# Patient Record
Sex: Male | Born: 1950 | Race: Black or African American | Hispanic: No | Marital: Married | Smoking: Former smoker
Health system: Southern US, Community
[De-identification: ages and names within clinical notes are randomized; demographics above are authoritative.]

---

## 2003-05-24 DIAGNOSIS — B20 Human immunodeficiency virus [HIV] disease: Secondary | ICD-10-CM

## 2006-03-09 ENCOUNTER — Ambulatory Visit: Payer: Self-pay | Admitting: Infectious Diseases

## 2006-03-09 ENCOUNTER — Encounter: Admission: RE | Admit: 2006-03-09 | Discharge: 2006-03-09 | Payer: Self-pay | Admitting: Infectious Diseases

## 2006-03-09 ENCOUNTER — Encounter (INDEPENDENT_AMBULATORY_CARE_PROVIDER_SITE_OTHER): Payer: Self-pay | Admitting: *Deleted

## 2006-03-25 ENCOUNTER — Ambulatory Visit: Payer: Self-pay | Admitting: Infectious Diseases

## 2006-04-26 ENCOUNTER — Ambulatory Visit: Payer: Self-pay | Admitting: Infectious Diseases

## 2006-04-26 ENCOUNTER — Encounter (INDEPENDENT_AMBULATORY_CARE_PROVIDER_SITE_OTHER): Payer: Self-pay | Admitting: *Deleted

## 2006-04-26 ENCOUNTER — Encounter: Admission: RE | Admit: 2006-04-26 | Discharge: 2006-04-26 | Payer: Self-pay | Admitting: Infectious Diseases

## 2006-07-21 ENCOUNTER — Encounter: Admission: RE | Admit: 2006-07-21 | Discharge: 2006-07-21 | Payer: Self-pay | Admitting: Infectious Diseases

## 2006-07-21 ENCOUNTER — Ambulatory Visit: Payer: Self-pay | Admitting: Infectious Diseases

## 2006-07-21 ENCOUNTER — Encounter (INDEPENDENT_AMBULATORY_CARE_PROVIDER_SITE_OTHER): Payer: Self-pay | Admitting: *Deleted

## 2006-11-10 ENCOUNTER — Encounter (INDEPENDENT_AMBULATORY_CARE_PROVIDER_SITE_OTHER): Payer: Self-pay | Admitting: *Deleted

## 2006-11-10 ENCOUNTER — Ambulatory Visit: Payer: Self-pay | Admitting: Infectious Diseases

## 2006-11-10 ENCOUNTER — Encounter: Admission: RE | Admit: 2006-11-10 | Discharge: 2006-11-10 | Payer: Self-pay | Admitting: Infectious Diseases

## 2006-11-10 DIAGNOSIS — A6000 Herpesviral infection of urogenital system, unspecified: Secondary | ICD-10-CM | POA: Insufficient documentation

## 2006-11-10 DIAGNOSIS — I10 Essential (primary) hypertension: Secondary | ICD-10-CM | POA: Insufficient documentation

## 2006-11-10 DIAGNOSIS — F17200 Nicotine dependence, unspecified, uncomplicated: Secondary | ICD-10-CM | POA: Insufficient documentation

## 2006-11-10 LAB — CONVERTED CEMR LAB
Albumin: 4.3 g/dL (ref 3.5–5.2)
BUN: 12 mg/dL (ref 6–23)
CD4 Count: 250 microliters
CO2: 27 meq/L (ref 19–32)
Calcium: 9.6 mg/dL (ref 8.4–10.5)
Chloride: 106 meq/L (ref 96–112)
Cholesterol: 181 mg/dL (ref 0–200)
Glucose, Bld: 84 mg/dL (ref 70–99)
HDL: 36 mg/dL — ABNORMAL LOW (ref >39)
HIV 1 RNA Quant: 312 copies/mL
Hemoglobin: 14.6 g/dL (ref 13.0–17.0)
MCHC: 34.3 g/dL (ref 30.0–36.0)
Potassium: 4.5 meq/L (ref 3.5–5.3)
RBC: 4.26 M/uL (ref 4.22–5.81)
Triglycerides: 205 mg/dL — ABNORMAL HIGH (ref <150)

## 2006-11-19 ENCOUNTER — Encounter: Payer: Self-pay | Admitting: Infectious Diseases

## 2006-12-20 ENCOUNTER — Encounter (INDEPENDENT_AMBULATORY_CARE_PROVIDER_SITE_OTHER): Payer: Self-pay | Admitting: *Deleted

## 2006-12-20 LAB — CONVERTED CEMR LAB

## 2007-01-02 ENCOUNTER — Encounter (INDEPENDENT_AMBULATORY_CARE_PROVIDER_SITE_OTHER): Payer: Self-pay | Admitting: *Deleted

## 2007-01-31 ENCOUNTER — Ambulatory Visit: Payer: Self-pay | Admitting: Infectious Diseases

## 2007-03-23 ENCOUNTER — Telehealth: Payer: Self-pay | Admitting: Infectious Diseases

## 2007-06-24 ENCOUNTER — Telehealth: Payer: Self-pay | Admitting: Infectious Diseases

## 2007-07-25 ENCOUNTER — Telehealth: Payer: Self-pay | Admitting: Infectious Diseases

## 2007-08-11 ENCOUNTER — Ambulatory Visit: Payer: Self-pay | Admitting: Infectious Disease

## 2007-08-11 ENCOUNTER — Encounter (INDEPENDENT_AMBULATORY_CARE_PROVIDER_SITE_OTHER): Payer: Self-pay | Admitting: *Deleted

## 2007-09-20 ENCOUNTER — Ambulatory Visit: Payer: Self-pay | Admitting: Infectious Diseases

## 2007-09-20 ENCOUNTER — Encounter: Admission: RE | Admit: 2007-09-20 | Discharge: 2007-09-20 | Payer: Self-pay | Admitting: Infectious Diseases

## 2007-09-20 LAB — CONVERTED CEMR LAB
CO2: 26 meq/L (ref 19–32)
Calcium: 9.8 mg/dL (ref 8.4–10.5)
Chloride: 105 meq/L (ref 96–112)
Creatinine, Ser: 0.83 mg/dL (ref 0.40–1.50)
Glucose, Bld: 81 mg/dL (ref 70–99)
HCT: 43.7 % (ref 39.0–52.0)
HIV 1 RNA Quant: <50 copies/mL (ref <50)
HIV-1 RNA Quant, Log: <1.7 (ref <1.70)
MCV: 100.9 fL — ABNORMAL HIGH (ref 78.0–100.0)
RBC: 4.33 M/uL (ref 4.22–5.81)
Sodium: 141 meq/L (ref 135–145)
Total Bilirubin: 0.3 mg/dL (ref 0.3–1.2)
Total Protein: 7.7 g/dL (ref 6.0–8.3)
WBC: 3.9 10*3/uL — ABNORMAL LOW (ref 4.0–10.5)

## 2007-10-06 ENCOUNTER — Telehealth: Payer: Self-pay | Admitting: Infectious Diseases

## 2007-10-25 ENCOUNTER — Encounter (INDEPENDENT_AMBULATORY_CARE_PROVIDER_SITE_OTHER): Payer: Self-pay | Admitting: *Deleted

## 2008-05-22 ENCOUNTER — Encounter: Payer: Self-pay | Admitting: Infectious Diseases

## 2008-07-13 ENCOUNTER — Ambulatory Visit: Payer: Self-pay | Admitting: Infectious Diseases

## 2008-07-13 LAB — CONVERTED CEMR LAB
ALT: 19 units/L (ref 0–53)
AST: 18 units/L (ref 0–37)
Albumin: 4.3 g/dL (ref 3.5–5.2)
Basophils Absolute: 0 10*3/uL (ref 0.0–0.1)
Basophils Relative: 1 % (ref 0–1)
CO2: 26 meq/L (ref 19–32)
Calcium: 9.2 mg/dL (ref 8.4–10.5)
Chloride: 107 meq/L (ref 96–112)
Cholesterol: 167 mg/dL (ref 0–200)
HIV 1 RNA Quant: 350 copies/mL — ABNORMAL HIGH (ref <50)
HIV-1 RNA Quant, Log: 2.54 — ABNORMAL HIGH (ref <1.70)
Hemoglobin: 14.2 g/dL (ref 13.0–17.0)
Lymphocytes Relative: 39 % (ref 12–46)
Monocytes Absolute: 0.3 10*3/uL (ref 0.1–1.0)
Neutro Abs: 2.3 10*3/uL (ref 1.7–7.7)
Neutrophils Relative %: 51 % (ref 43–77)
Platelets: 305 10*3/uL (ref 150–400)
Potassium: 4.7 meq/L (ref 3.5–5.3)
RDW: 13.5 % (ref 11.5–15.5)
Sodium: 141 meq/L (ref 135–145)
Total Protein: 7 g/dL (ref 6.0–8.3)

## 2008-07-25 ENCOUNTER — Ambulatory Visit: Payer: Self-pay | Admitting: Infectious Diseases

## 2008-08-27 ENCOUNTER — Telehealth: Payer: Self-pay | Admitting: Infectious Diseases

## 2008-11-13 ENCOUNTER — Ambulatory Visit: Payer: Self-pay | Admitting: Infectious Diseases

## 2008-11-13 LAB — CONVERTED CEMR LAB
ALT: 23 units/L (ref 0–53)
AST: 21 units/L (ref 0–37)
Albumin: 4.4 g/dL (ref 3.5–5.2)
BUN: 8 mg/dL (ref 6–23)
Basophils Absolute: 0 10*3/uL (ref 0.0–0.1)
Basophils Relative: 0 % (ref 0–1)
CO2: 25 meq/L (ref 19–32)
Calcium: 9.4 mg/dL (ref 8.4–10.5)
Chloride: 108 meq/L (ref 96–112)
Cholesterol: 177 mg/dL (ref 0–200)
Creatinine, Ser: 0.75 mg/dL (ref 0.40–1.50)
HIV-1 RNA Quant, Log: 2.83 — ABNORMAL HIGH (ref <1.68)
Hemoglobin: 13.3 g/dL (ref 13.0–17.0)
Hep A Total Ab: NEGATIVE
Lymphocytes Relative: 35 % (ref 12–46)
MCHC: 33.9 g/dL (ref 30.0–36.0)
Monocytes Absolute: 0.4 10*3/uL (ref 0.1–1.0)
Monocytes Relative: 7 % (ref 3–12)
Neutro Abs: 3.1 10*3/uL (ref 1.7–7.7)
Neutrophils Relative %: 57 % (ref 43–77)
Potassium: 4.3 meq/L (ref 3.5–5.3)
RBC: 4 M/uL — ABNORMAL LOW (ref 4.22–5.81)

## 2008-11-14 ENCOUNTER — Encounter: Payer: Self-pay | Admitting: Infectious Diseases

## 2008-11-27 ENCOUNTER — Ambulatory Visit: Payer: Self-pay | Admitting: Infectious Diseases

## 2008-12-06 ENCOUNTER — Ambulatory Visit: Payer: Self-pay | Admitting: Gastroenterology

## 2008-12-07 ENCOUNTER — Encounter (INDEPENDENT_AMBULATORY_CARE_PROVIDER_SITE_OTHER): Payer: Self-pay | Admitting: *Deleted

## 2008-12-18 ENCOUNTER — Ambulatory Visit: Payer: Self-pay | Admitting: Infectious Diseases

## 2008-12-18 ENCOUNTER — Ambulatory Visit (HOSPITAL_COMMUNITY): Admission: RE | Admit: 2008-12-18 | Discharge: 2008-12-18 | Payer: Self-pay | Admitting: Infectious Diseases

## 2008-12-18 ENCOUNTER — Telehealth (INDEPENDENT_AMBULATORY_CARE_PROVIDER_SITE_OTHER): Payer: Self-pay | Admitting: *Deleted

## 2008-12-18 DIAGNOSIS — J321 Chronic frontal sinusitis: Secondary | ICD-10-CM

## 2008-12-19 DIAGNOSIS — I4949 Other premature depolarization: Secondary | ICD-10-CM

## 2009-04-17 ENCOUNTER — Ambulatory Visit: Payer: Self-pay | Admitting: Infectious Diseases

## 2009-04-17 LAB — CONVERTED CEMR LAB
ALT: 18 units/L (ref 0–53)
AST: 19 units/L (ref 0–37)
Albumin: 4.3 g/dL (ref 3.5–5.2)
Alkaline Phosphatase: 87 units/L (ref 39–117)
Basophils Absolute: 0.1 10*3/uL (ref 0.0–0.1)
Eosinophils Absolute: 0.1 10*3/uL (ref 0.0–0.7)
Eosinophils Relative: 3 % (ref 0–5)
GFR calc non Af Amer: >60 mL/min (ref >60)
HCT: 40.1 % (ref 39.0–52.0)
HIV-1 RNA Quant, Log: 1.98 — ABNORMAL HIGH (ref <1.68)
Hemoglobin: 13.3 g/dL (ref 13.0–17.0)
Lymphocytes Relative: 38 % (ref 12–46)
MCHC: 33.2 g/dL (ref 30.0–36.0)
MCV: 98.5 fL (ref 78.0–100.0)
Monocytes Absolute: 0.4 10*3/uL (ref 0.1–1.0)
Potassium: 4.9 meq/L (ref 3.5–5.3)
RDW: 13.9 % (ref 11.5–15.5)
Sodium: 144 meq/L (ref 135–145)
Total Bilirubin: 0.2 mg/dL — ABNORMAL LOW (ref 0.3–1.2)
Total Protein: 7.3 g/dL (ref 6.0–8.3)

## 2009-05-01 ENCOUNTER — Ambulatory Visit: Payer: Self-pay | Admitting: Infectious Diseases

## 2009-05-01 ENCOUNTER — Ambulatory Visit (HOSPITAL_COMMUNITY): Admission: RE | Admit: 2009-05-01 | Discharge: 2009-05-01 | Payer: Self-pay | Admitting: Infectious Diseases

## 2009-05-01 DIAGNOSIS — M549 Dorsalgia, unspecified: Secondary | ICD-10-CM | POA: Insufficient documentation

## 2009-05-14 ENCOUNTER — Telehealth (INDEPENDENT_AMBULATORY_CARE_PROVIDER_SITE_OTHER): Payer: Self-pay | Admitting: *Deleted

## 2009-08-26 IMAGING — CR DG LUMBAR SPINE COMPLETE 4+V
5 series · 5 of 5 positions shown · non-contrast
Comparison: None.

CLINICAL DATA: 58-year-old male with recent injury and low back,
right hip pain.

LUMBAR SPINE - COMPLETE 4+ VIEW

[t l-spine a.p.]
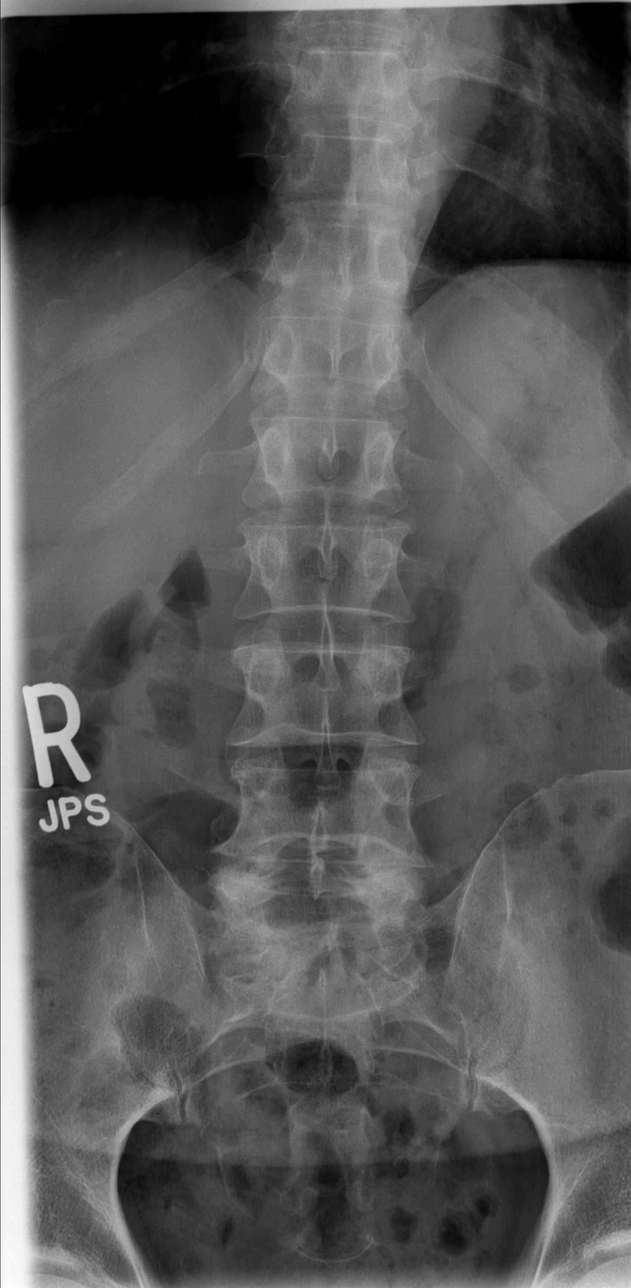

[t l-spine oblique exposure (1 of 2)]
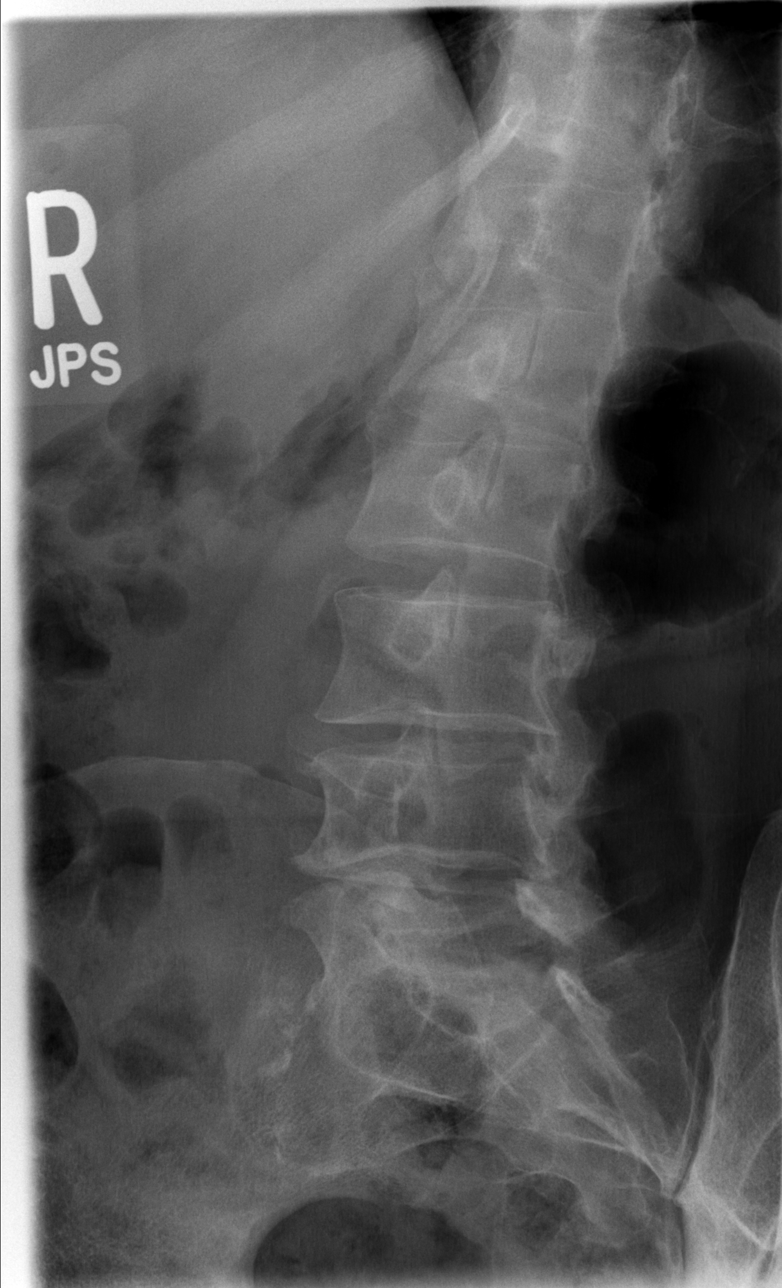

[t l-spine oblique exposure (2 of 2)]
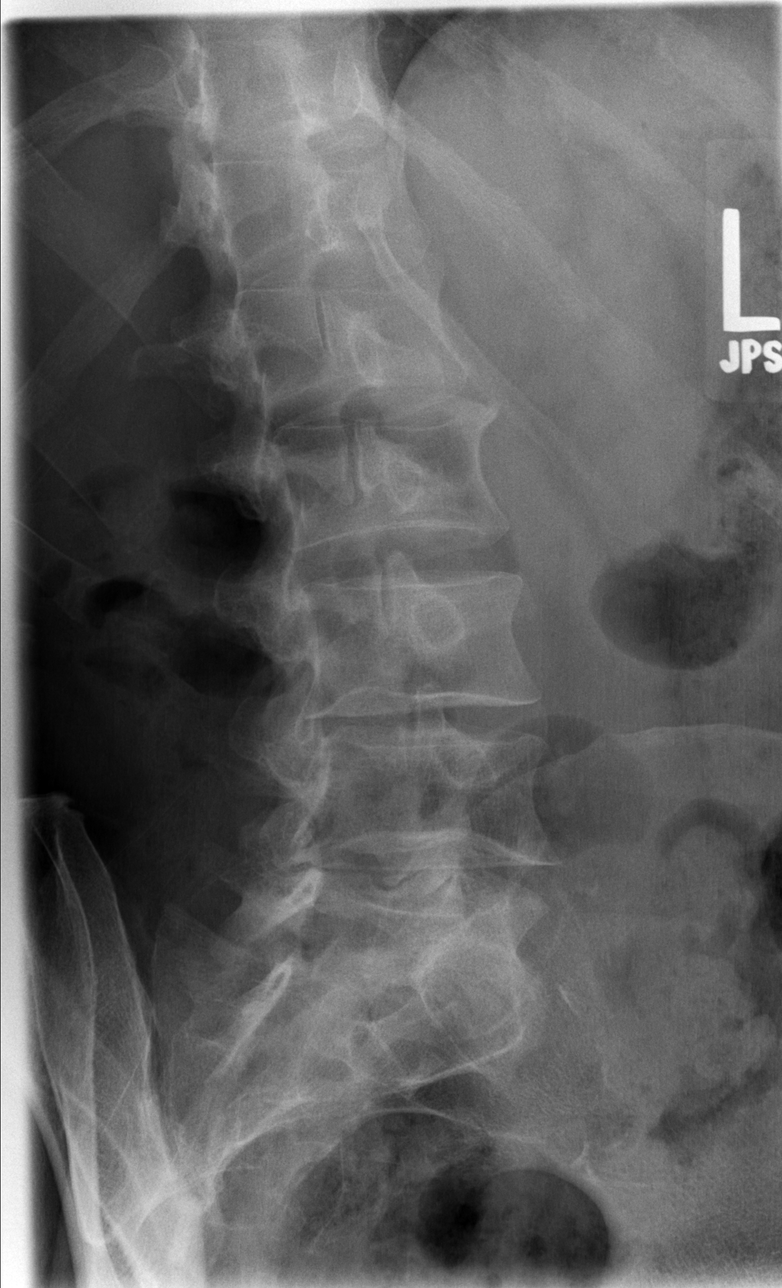

[t l-spine lat]
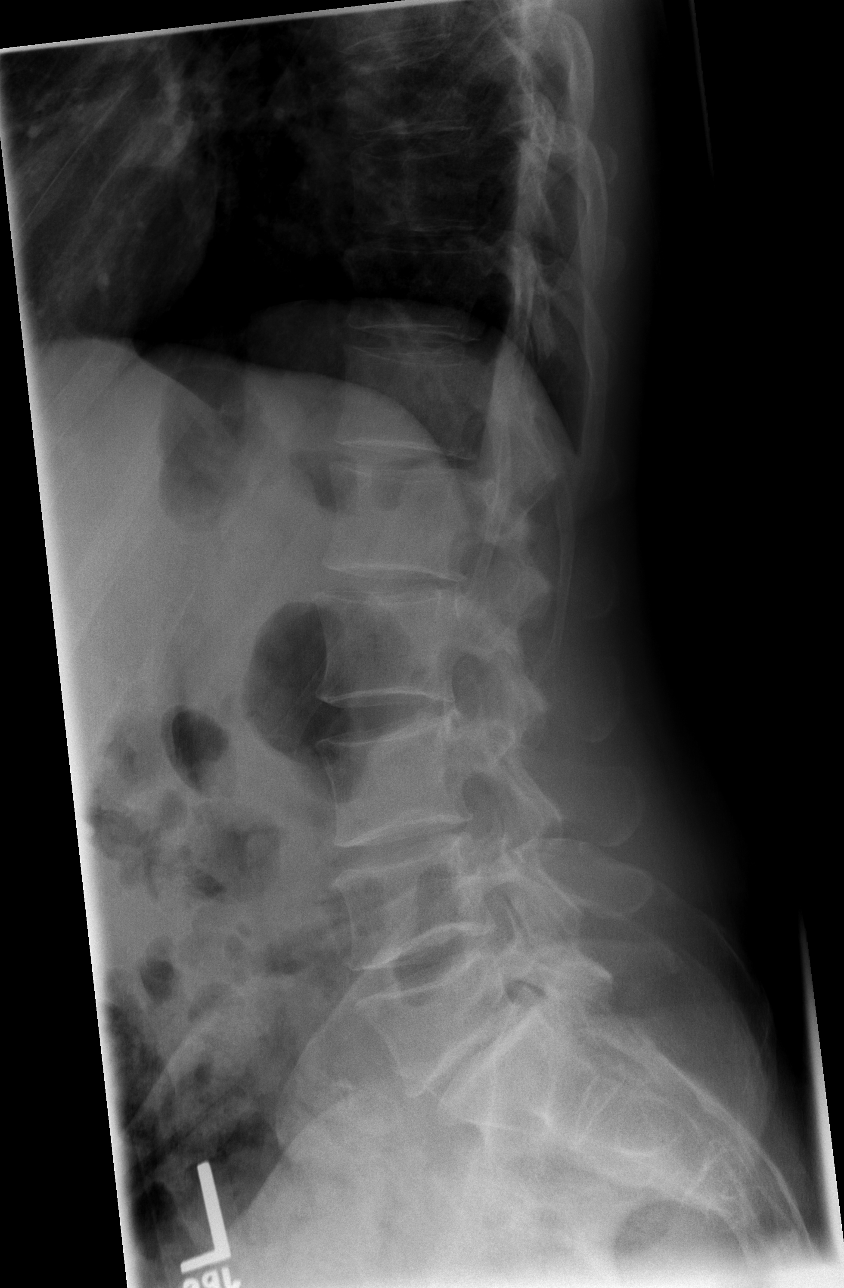

[t l-spine l5-s1 spot]
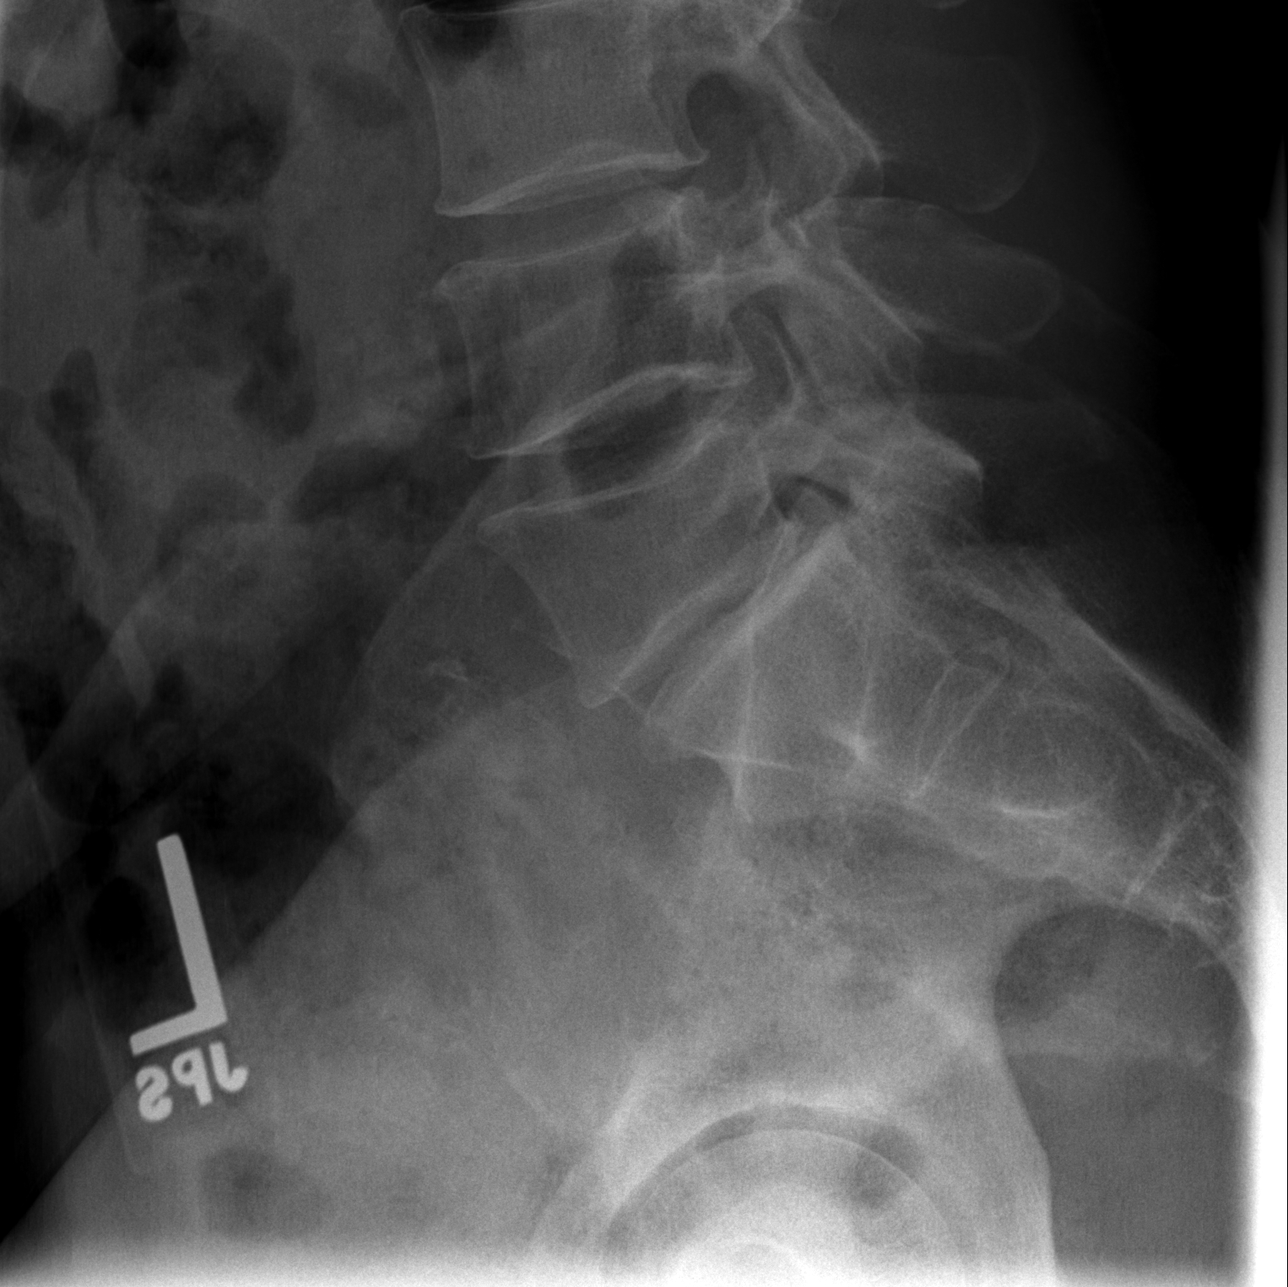

[5 of 5 positions shown; findings below may reference images not displayed]

FINDINGS: Lumbar segmentation appears within normal limits.  There
is disc space narrowing with endplate sclerosis L5-S1.  There is
grade 1 anterolisthesis of L5 on S1.  No definite pars fracture.
There is mild loss of L4 and L5 vertebral body height, no definite
acute vertebral body fracture identified.  Calcified
atherosclerosis.  Visualized lower thoracic levels and pelvis
appear intact.
IMPRESSION: 1.  Degenerative changes L5-S1 including grade 1 anterolisthesis
and loss of disc space height.
2.  No definite acute osseous findings in the lumbar spine; mild L4
and L5 loss of vertebral body height appears chronic.

## 2010-01-14 ENCOUNTER — Encounter (INDEPENDENT_AMBULATORY_CARE_PROVIDER_SITE_OTHER): Payer: Self-pay | Admitting: *Deleted

## 2010-02-13 ENCOUNTER — Ambulatory Visit: Payer: Self-pay | Admitting: Infectious Diseases

## 2010-02-13 LAB — CONVERTED CEMR LAB
ALT: 17 units/L (ref 0–53)
CO2: 24 meq/L (ref 19–32)
Calcium: 9.4 mg/dL (ref 8.4–10.5)
Chloride: 106 meq/L (ref 96–112)
Creatinine, Ser: 0.88 mg/dL (ref 0.40–1.50)
Glucose, Bld: 94 mg/dL (ref 70–99)
HCT: 42.3 % (ref 39.0–52.0)
HDL: 36 mg/dL — ABNORMAL LOW (ref >39)
Lymphocytes Relative: 31 % (ref 12–46)
Lymphs Abs: 1.6 10*3/uL (ref 0.7–4.0)
Neutrophils Relative %: 59 % (ref 43–77)
Platelets: 275 10*3/uL (ref 150–400)
Total Protein: 6.9 g/dL (ref 6.0–8.3)
Triglycerides: 136 mg/dL (ref <150)
WBC: 5 10*3/uL (ref 4.0–10.5)

## 2010-03-05 ENCOUNTER — Ambulatory Visit: Payer: Self-pay | Admitting: Infectious Diseases

## 2010-09-23 ENCOUNTER — Encounter (INDEPENDENT_AMBULATORY_CARE_PROVIDER_SITE_OTHER): Payer: Self-pay | Admitting: *Deleted

## 2010-10-23 ENCOUNTER — Encounter: Payer: Self-pay | Admitting: Infectious Diseases

## 2010-11-25 NOTE — Miscellaneous (Signed)
  Clinical Lists Changes  Observations: Added new observation of YEARAIDSPOS: 2007  (09/23/2010 9:51)

## 2010-11-25 NOTE — Assessment & Plan Note (Signed)
Summary: F/U APPT/VS   CC:  follow-up visit.  History of Present Illness: 60 yo M with AIDS/HIV.  His last CD4 was 430,  vl <48  (02-13-10).    Preventive Screening-Counseling & Management  Alcohol-Tobacco     Alcohol drinks/day: 0     Smoking Status: current     Smoking Cessation Counseling: yes     Smoke Cessation Stage: contemplative     Packs/Day: 1.0  Caffeine-Diet-Exercise     Caffeine use/day: coffee, tea occassionally     Does Patient Exercise: yes     Type of exercise: active around house/yard  Safety-Violence-Falls     Seat Belt Use: yes   Updated Prior Medication List: ENALAPRIL MALEATE 5 MG TABS (ENALAPRIL MALEATE) one by mouth once daily ATRIPLA 600-200-300 MG TABS (EFAVIRENZ-EMTRICITAB-TENOFOVIR) one by mouth at bed time  Current Allergies (reviewed today): ! * BUG BITES Past History:  Past medical, surgical, family and social histories (including risk factors) reviewed, and no changes noted (except as noted below).  Past Medical History: Reviewed history from 11/10/2006 and no changes required. HIV disease Hypertension  Family History: Reviewed history from 07/25/2008 and no changes required. Family History of CAD Male 1st degree relative <50  Social History: Reviewed history from 07/25/2008 and no changes required. Current Smoker-1/2 ppd. not clear what it would take for him to quit.  Alcohol use-no on DA  Review of Systems       The patient complains of weight gain and headaches.  The patient denies chest pain and dyspnea on exertion.         has had some headache after having bug bite on the back of his neck.  cont to smoke- believes he will quit by next visit. wt up > 10#.   Vital Signs:  Patient profile:   60 year old male Height:      73 inches (185.42 cm) Weight:      194.12 pounds (88.24 kg) BMI:     25.70 Temp:     98.4 degrees F (36.89 degrees C) oral Pulse rate:   82 / minute BP sitting:   138 / 91  (left arm)  Vitals Entered  By: Baxter Hire) (Mar 05, 2010 10:35 AM) CC: follow-up visit Pain Assessment Patient in pain? no      Nutritional Status BMI of 25 - 29 = overweight Nutritional Status Detail appetite is pretty good per patient  Have you ever been in a relationship where you felt threatened, hurt or afraid?No   Does patient need assistance? Functional Status Self care Ambulation Normal   Physical Exam  General:  well-developed, well-nourished, and well-hydrated.   Eyes:  pupils equal, pupils round, and pupils reactive to light.   Mouth:  pharynx pink and moist, no exudates, and poor dentition.   Neck:  no masses.  2 indurated areas on the back of his ncek c/i his hx of bug bites. non-fluctuant.  Lungs:  normal respiratory effort and normal breath sounds.   Heart:  normal rate, regular rhythm, and no murmur.   Abdomen:  soft, non-tender, and normal bowel sounds.          Medication Adherence: 03/05/2010   Adherence to medications reviewed with patient. Counseling to provide adequate adherence provided   Prevention For Positives: 03/05/2010   Safe sex practices discussed with patient. Condoms offered.  Impression & Recommendations:  Problem # 1:  HIV DISEASE (ICD-042) he is doing well. offered condoms, spent > 10 minutes explaining virilogy, immunology to him. NEEDS 2nd hep A, ? colonoscopy return to clinic 5-6 months.   Problem # 2:  TOBACCO USER (ICD-305.1) will quit by next visit.   Problem # 3:  HYPERTENSION (ICD-401.9) doing well. will cont current rx and cont to watch.  His updated medication list for this problem includes:    Enalapril Maleate 5 Mg Tabs (Enalapril maleate) ..... One by mouth once daily  Other Orders: Est. Patient Level IV (21308)  Current Medications (verified): 1)  Enalapril Maleate 5 Mg Tabs (Enalapril Maleate) .... One By Mouth Once Daily 2)  Atripla 600-200-300 Mg Tabs (Efavirenz-Emtricitab-Tenofovir) .... One  By Mouth At Bed Time  Allergies (verified): 1)  ! * Bug Bites

## 2010-11-25 NOTE — Miscellaneous (Signed)
Summary: clinical update/ryan white [facility]ADAP app completed  Clinical Lists Changes  Observations: Added new observation of RWTITLE: B (01/14/2010 11:09) Added new observation of PAYOR: No Insurance (01/14/2010 11:09) Added new observation of PCTFPL: 206.53  (01/14/2010 11:09) Added new observation of HOUSEINCOME: 16109  (01/14/2010 11:09) Added new observation of FINASSESSDT: 01/14/2010  (01/14/2010 11:09) Added new observation of YEARLYEXPEN: 21120  (01/14/2010 11:09)

## 2010-11-27 NOTE — Miscellaneous (Signed)
Summary: RW Update  Clinical Lists Changes  Observations: Added new observation of PAYOR: Private (10/23/2010 9:08)

## 2010-12-19 ENCOUNTER — Other Ambulatory Visit: Payer: Self-pay | Admitting: Infectious Diseases

## 2010-12-19 ENCOUNTER — Other Ambulatory Visit (INDEPENDENT_AMBULATORY_CARE_PROVIDER_SITE_OTHER): Payer: Self-pay

## 2010-12-19 ENCOUNTER — Ambulatory Visit: Payer: Self-pay

## 2010-12-19 ENCOUNTER — Encounter: Payer: Self-pay | Admitting: Infectious Diseases

## 2010-12-19 DIAGNOSIS — B20 Human immunodeficiency virus [HIV] disease: Secondary | ICD-10-CM

## 2010-12-19 LAB — CONVERTED CEMR LAB
ALT: 17 units/L (ref 0–53)
AST: 16 units/L (ref 0–37)
Albumin: 4.1 g/dL (ref 3.5–5.2)
Basophils Absolute: 0 10*3/uL (ref 0.0–0.1)
Calcium: 9.1 mg/dL (ref 8.4–10.5)
Chloride: 108 meq/L (ref 96–112)
Cholesterol: 177 mg/dL (ref 0–200)
Creatinine, Ser: 0.87 mg/dL (ref 0.40–1.50)
Eosinophils Relative: 2 % (ref 0–5)
HCT: 41.5 % (ref 39.0–52.0)
HIV 1 RNA Quant: <20 copies/mL (ref <20)
HIV-1 RNA Quant, Log: <1.3 (ref <1.30)
Lymphocytes Relative: 32 % (ref 12–46)
Platelets: 277 10*3/uL (ref 150–400)
Potassium: 3.8 meq/L (ref 3.5–5.3)
RDW: 14.2 % (ref 11.5–15.5)
Sodium: 141 meq/L (ref 135–145)
Total CHOL/HDL Ratio: 3.9

## 2010-12-19 LAB — T-HELPER CELL (CD4) - (RCID CLINIC ONLY)
CD4 % Helper T Cell: 29 % — ABNORMAL LOW (ref 33–55)
CD4 T Cell Abs: 530 uL (ref 400–2700)

## 2010-12-23 NOTE — Assessment & Plan Note (Addendum)
Summary: FLU SHOT/VS  Nurse Visit   Allergies: 1)  ! * Bug Bites  Immunizations Administered:  Influenza Vaccine # 1:    Vaccine Type: Fluvax Non-MCR    Site: right deltoid    Mfr: GlaxoSmithKline    Dose: 0.5 ml    Route: IM    Given by: Starleen Arms CMA    Exp. Date: 04/25/2011    Lot #: [id number]    VIS given: 05/20/10 version given December 19, 2010.  Flu Vaccine Consent Questions:    Do you have a history of severe allergic reactions to this vaccine? no    Any prior history of allergic reactions to egg and/or gelatin? no    Do you have a sensitivity to the preservative Thimersol? no    Do you have a past history of Guillan-Barre Syndrome? no    Do you currently have an acute febrile illness? no    Have you ever had a severe reaction to latex? no    Vaccine information given and explained to patient? yes  Orders Added: 1)  Influenza Vaccine NON MCR [00028]

## 2010-12-29 ENCOUNTER — Ambulatory Visit (INDEPENDENT_AMBULATORY_CARE_PROVIDER_SITE_OTHER): Payer: Self-pay | Admitting: Infectious Diseases

## 2010-12-29 ENCOUNTER — Encounter: Payer: Self-pay | Admitting: Infectious Diseases

## 2010-12-29 DIAGNOSIS — Z23 Encounter for immunization: Secondary | ICD-10-CM

## 2010-12-29 DIAGNOSIS — B20 Human immunodeficiency virus [HIV] disease: Secondary | ICD-10-CM

## 2011-01-09 ENCOUNTER — Encounter (INDEPENDENT_AMBULATORY_CARE_PROVIDER_SITE_OTHER): Payer: Self-pay | Admitting: *Deleted

## 2011-01-13 LAB — T-HELPER CELL (CD4) - (RCID CLINIC ONLY)
CD4 % Helper T Cell: 28 % — ABNORMAL LOW (ref 33–55)
CD4 T Cell Abs: 430 uL (ref 400–2700)

## 2011-01-13 NOTE — Miscellaneous (Signed)
  Clinical Lists Changes  Observations: Added new observation of FINASSESSDT: 01/09/2011 (01/09/2011 14:48) Added new observation of AIDSDAP: PENDING APPROVAL 2012 (01/09/2011 14:48) Added new observation of PCTFPL: 207.22  (01/09/2011 14:48) Added new observation of HOUSEINCOME: 40102  (01/09/2011 14:48)

## 2011-01-13 NOTE — Assessment & Plan Note (Signed)
Summary: 2WK F/U/VS   Vital Signs:  Patient profile:   60 year old male Height:      73 inches (185.42 cm) Weight:      183.0 pounds (83.18 kg) BMI:     24.23 Temp:     98.4 degrees F (36.89 degrees C) oral Pulse rate:   87 / minute BP sitting:   135 / 90  (right arm)  Vitals Entered By: Wendall Mola CMA Duncan Dull) (December 29, 2010 9:04 AM) CC: follow-up visit, lab results Is Patient Diabetic? No Pain Assessment Patient in pain? no      Nutritional Status BMI of 19 -24 = normal Nutritional Status Detail appetite "very good"  Have you ever been in a relationship where you felt threatened, hurt or afraid?No   Does patient need assistance? Functional Status Self care Ambulation Normal Comments no missed doses of Atripla per pt.   CC:  follow-up visit and lab results.  History of Present Illness: 60 yo M with AIDS/HIV.  His last CD4 was 530,  VL <20  (12-19-10).  Had elavated BP reading this AM due to rush of getting to clinic. Cont to smoke. Believes he will quit in a "few more days". Wt down 11#, he attributes this to walking alot at work.   Preventive Screening-Counseling & Management  Alcohol-Tobacco     Alcohol drinks/day: 0     Smoking Status: current     Smoking Cessation Counseling: yes     Smoke Cessation Stage: contemplative     Packs/Day: 1.0     Passive Smoke Exposure: no  Caffeine-Diet-Exercise     Caffeine use/day: coffee, tea occassionally     Does Patient Exercise: yes     Type of exercise: active around house/yard     Times/week: 4  Hep-HIV-STD-Contraception     HIV Risk Counseling: 11/10/2006  Safety-Violence-Falls     Seat Belt Use: yes      Drug Use:  no.    Comments: pt. given condoms  Current Medications (verified): 1)  Enalapril Maleate 5 Mg Tabs (Enalapril Maleate) .... One By Mouth Once Daily 2)  Atripla 600-200-300 Mg Tabs (Efavirenz-Emtricitab-Tenofovir) .... One By Mouth At Bed Time  Allergies: 1)  ! * Bug Bites  Social  History: Current Smoker-1/2 ppd. not clear what it would take for him to quit.  Alcohol use-no on DA Married  Physical Exam  General:  well-developed, well-nourished, and well-hydrated.   Eyes:  pupils equal, pupils round, and pupils reactive to light.   Mouth:  pharynx pink and moist, no erythema, and no exudates.   Neck:  no masses.   Lungs:  normal respiratory effort and normal breath sounds.   Heart:  normal rate, regular rhythm, and no murmur.   Abdomen:  soft, non-tender, and normal bowel sounds.     Impression & Recommendations:  Problem # 1:  HIV DISEASE (ICD-042) he is doing very well. lipids good. will cont his meds. he is given condoms. gets pnvx booster and Hep A #2. return to clinic 4-5 months with labs prior.   Problem # 2:  TOBACCO USER (ICD-305.1) tring to quit  Problem # 3:  HYPERTENSION (ICD-401.9) will cont his ACE-i. Cr stable.  His updated medication list for this problem includes:    Enalapril Maleate 5 Mg Tabs (Enalapril maleate) ..... One by mouth once daily  Other Orders: Pneumococcal Vaccine (16109) Admin 1st Vaccine (60454) Est. Patient Level IV (09811) Hepatitis A Vaccine (Adult Dose) (91478) Admin  of Any Addtl Vaccine (81191) Future Orders: T-CD4SP ([facility]) (CD4SP) ... 03/29/2011 T-HIV Viral Load [phone]) ... 03/29/2011 T-Comprehensive Metabolic Panel [phone]) ... 03/29/2011 T-CBC w/Diff (29528-41324) ... 03/29/2011    Orders Added: 1)  Pneumococcal Vaccine [90732] 2)  Admin 1st Vaccine [90471] 3)  Est. Patient Level IV [40102] 4)  T-CD4SP ([facility]) [CD4SP] 5)  T-HIV Viral Load [phone] 6)  T-Comprehensive Metabolic Panel [80053-22900] 7)  T-CBC w/Diff [47425-95638] 8)  Hepatitis A Vaccine (Adult Dose) [90632] 9)  Admin of Any Addtl Vaccine [90472]   Immunizations Administered:  Pneumonia Vaccine:    Vaccine Type: Pneumovax    Site: right deltoid    Mfr: Merck    Dose: 0.5 ml    Route: IM    Given by:  Wendall Mola CMA ( AAMA)    Exp. Date: 05/21/2012    Lot #: [id number]    VIS given: 09/30/09 version given December 29, 2010.  Hepatitis A Vaccine # 2:    Vaccine Type: HepA    Site: left deltoid    Mfr: GlaxoSmithKline    Dose: 0.5 ml    Route: IM    Given by: Wendall Mola CMA ( AAMA)    Exp. Date: 10/08/2012    Lot #: [id number]    VIS given: 01/13/05 version given December 29, 2010.   Immunizations Administered:  Pneumonia Vaccine:    Vaccine Type: Pneumovax    Site: right deltoid    Mfr: Merck    Dose: 0.5 ml    Route: IM    Given by: Wendall Mola CMA ( AAMA)    Exp. Date: 05/21/2012    Lot #: [id number]    VIS given: 09/30/09 version given December 29, 2010.  Hepatitis A Vaccine # 2:    Vaccine Type: HepA    Site: left deltoid    Mfr: GlaxoSmithKline    Dose: 0.5 ml    Route: IM    Given by: Wendall Mola CMA ( AAMA)    Exp. Date: 10/08/2012    Lot #: [id number]    VIS given: 01/13/05 version given December 29, 2010.        Medication Adherence: 12/29/2010   Adherence to medications reviewed with patient. Counseling to provide adequate adherence provided   Prevention For Positives: 12/29/2010   Safe sex practices discussed with patient. Condoms offered.

## 2011-02-02 LAB — T-HELPER CELL (CD4) - (RCID CLINIC ONLY): CD4 T Cell Abs: 450 uL (ref 400–2700)

## 2011-02-09 LAB — T-HELPER CELL (CD4) - (RCID CLINIC ONLY): CD4 % Helper T Cell: 28 % — ABNORMAL LOW (ref 33–55)

## 2011-08-04 LAB — T-HELPER CELL (CD4) - (RCID CLINIC ONLY): CD4 % Helper T Cell: 23 — ABNORMAL LOW

## 2011-09-24 ENCOUNTER — Encounter: Payer: Self-pay | Admitting: *Deleted

## 2011-10-22 ENCOUNTER — Telehealth: Payer: Self-pay | Admitting: *Deleted

## 2011-10-22 NOTE — Telephone Encounter (Signed)
Called patient to try and reschedule him for a follow up appointment and he says that he was not qualified for ADAP or Juanell Fairly and can not afford the meds or the visits. Advised that if something changes he will give [place] a call.

## 2022-08-27 ENCOUNTER — Other Ambulatory Visit: Payer: Self-pay

## 2022-08-27 DIAGNOSIS — Z113 Encounter for screening for infections with a predominantly sexual mode of transmission: Secondary | ICD-10-CM

## 2022-08-27 DIAGNOSIS — Z79899 Other long term (current) drug therapy: Secondary | ICD-10-CM

## 2022-08-27 DIAGNOSIS — B20 Human immunodeficiency virus [HIV] disease: Secondary | ICD-10-CM

## 2022-08-27 NOTE — Addendum Note (Signed)
Addended by: Daisy Floro T on: 08/27/2022 03:58 PM   Modules accepted: Orders

## 2022-08-28 ENCOUNTER — Other Ambulatory Visit: Payer: Self-pay | Admitting: Internal Medicine

## 2022-08-28 ENCOUNTER — Other Ambulatory Visit: Payer: Self-pay

## 2022-08-28 ENCOUNTER — Encounter: Payer: Self-pay | Admitting: Internal Medicine

## 2022-08-28 ENCOUNTER — Ambulatory Visit: Payer: Medicare HMO

## 2022-08-28 ENCOUNTER — Other Ambulatory Visit (HOSPITAL_COMMUNITY): Payer: Self-pay

## 2022-08-28 ENCOUNTER — Other Ambulatory Visit: Payer: Self-pay | Admitting: Nurse Practitioner

## 2022-08-28 ENCOUNTER — Other Ambulatory Visit: Payer: Medicare HMO

## 2022-08-28 ENCOUNTER — Ambulatory Visit
Admission: RE | Admit: 2022-08-28 | Discharge: 2022-08-28 | Disposition: A | Discharge status: Home / Self Care | Payer: Medicare HMO | Source: Ambulatory Visit | Attending: Nurse Practitioner | Admitting: Nurse Practitioner

## 2022-08-28 DIAGNOSIS — Z79899 Other long term (current) drug therapy: Secondary | ICD-10-CM

## 2022-08-28 DIAGNOSIS — B20 Human immunodeficiency virus [HIV] disease: Secondary | ICD-10-CM

## 2022-08-28 DIAGNOSIS — M25562 Pain in left knee: Secondary | ICD-10-CM

## 2022-08-28 DIAGNOSIS — Z113 Encounter for screening for infections with a predominantly sexual mode of transmission: Secondary | ICD-10-CM

## 2022-08-29 LAB — URINALYSIS
Bilirubin Urine: NEGATIVE
Glucose, UA: NEGATIVE
Hgb urine dipstick: NEGATIVE
Ketones, ur: NEGATIVE
Leukocytes,Ua: NEGATIVE
Nitrite: NEGATIVE
Protein, ur: NEGATIVE
Specific Gravity, Urine: 1.018 (ref 1.001–1.035)
pH: 6 (ref 5.0–8.0)

## 2022-08-31 LAB — URINE CYTOLOGY ANCILLARY ONLY
Chlamydia: NEGATIVE
Comment: NEGATIVE
Comment: NORMAL
Neisseria Gonorrhea: NEGATIVE

## 2022-09-04 LAB — CBC WITH DIFFERENTIAL/PLATELET
Absolute Monocytes: 352 cells/uL (ref 200–950)
Basophils Absolute: 62 cells/uL (ref 0–200)
Basophils Relative: 1.4 %
Eosinophils Absolute: 79 cells/uL (ref 15–500)
Eosinophils Relative: 1.8 %
HCT: 41.8 % (ref 38.5–50.0)
Hemoglobin: 14.5 g/dL (ref 13.2–17.1)
Lymphs Abs: 1852 cells/uL (ref 850–3900)
MCH: 32.6 pg (ref 27.0–33.0)
MCHC: 34.7 g/dL (ref 32.0–36.0)
MCV: 93.9 fL (ref 80.0–100.0)
MPV: 10.5 fL (ref 7.5–12.5)
Monocytes Relative: 8 %
Neutro Abs: 2055 cells/uL (ref 1500–7800)
Neutrophils Relative %: 46.7 %
Platelets: 304 10*3/uL (ref 140–400)
RBC: 4.45 10*6/uL (ref 4.20–5.80)
RDW: 13.4 % (ref 11.0–15.0)
Total Lymphocyte: 42.1 %
WBC: 4.4 10*3/uL (ref 3.8–10.8)

## 2022-09-04 LAB — T-HELPER CELLS (CD4) COUNT (NOT AT ARMC)
Absolute CD4: 481 cells/uL — ABNORMAL LOW (ref 490–1740)
CD4 T Helper %: 26 % — ABNORMAL LOW (ref 30–61)
Total lymphocyte count: 1855 cells/uL (ref 850–3900)

## 2022-09-04 LAB — COMPLETE METABOLIC PANEL WITH GFR
AG Ratio: 1.5 (calc) (ref 1.0–2.5)
ALT: 12 U/L (ref 9–46)
AST: 12 U/L (ref 10–35)
Albumin: 4.5 g/dL (ref 3.6–5.1)
Alkaline phosphatase (APISO): 79 U/L (ref 35–144)
BUN: 13 mg/dL (ref 7–25)
CO2: 26 mmol/L (ref 20–32)
Calcium: 10 mg/dL (ref 8.6–10.3)
Chloride: 106 mmol/L (ref 98–110)
Creat: 0.91 mg/dL (ref 0.70–1.35)
Globulin: 3.1 g/dL (calc) (ref 1.9–3.7)
Glucose, Bld: 98 mg/dL (ref 65–99)
Potassium: 4.1 mmol/L (ref 3.5–5.3)
Sodium: 141 mmol/L (ref 135–146)
Total Bilirubin: 0.3 mg/dL (ref 0.2–1.2)
Total Protein: 7.6 g/dL (ref 6.1–8.1)
eGFR: 91 mL/min/{1.73_m2} (ref >=60)

## 2022-09-04 LAB — HIV-1 RNA ULTRAQUANT REFLEX TO GENTYP+
HIV 1 RNA Quant: <20 copies/mL — AB
HIV-1 RNA Quant, Log: <1.3 Log copies/mL — AB

## 2022-09-04 LAB — LIPID PANEL
Cholesterol: 200 mg/dL — ABNORMAL HIGH (ref <200)
HDL: 39 mg/dL — ABNORMAL LOW (ref >=40)
LDL Cholesterol (Calc): 132 mg/dL (calc) — ABNORMAL HIGH
Non-HDL Cholesterol (Calc): 161 mg/dL (calc) — ABNORMAL HIGH (ref <130)
Total CHOL/HDL Ratio: 5.1 (calc) — ABNORMAL HIGH (ref <5.0)
Triglycerides: 156 mg/dL — ABNORMAL HIGH (ref <150)

## 2022-09-04 LAB — HIV-1/2 AB - DIFFERENTIATION
HIV-1 antibody: POSITIVE — AB
HIV-2 Ab: NEGATIVE

## 2022-09-04 LAB — HEPATITIS B SURFACE ANTIGEN: Hepatitis B Surface Ag: NONREACTIVE

## 2022-09-04 LAB — QUANTIFERON-TB GOLD PLUS
Mitogen-NIL: 4.87 IU/mL
NIL: 0.01 IU/mL
QuantiFERON-TB Gold Plus: NEGATIVE
TB1-NIL: 0.01 IU/mL
TB2-NIL: 0.02 IU/mL

## 2022-09-04 LAB — HEPATITIS B SURFACE ANTIBODY,QUALITATIVE: Hep B S Ab: NONREACTIVE

## 2022-09-04 LAB — RPR: RPR Ser Ql: NONREACTIVE

## 2022-09-04 LAB — HEPATITIS C ANTIBODY: Hepatitis C Ab: NONREACTIVE

## 2022-09-04 LAB — HLA B*5701: HLA-B*5701 w/rflx HLA-B High: NEGATIVE

## 2022-09-04 LAB — HEPATITIS B CORE ANTIBODY, TOTAL: Hep B Core Total Ab: NONREACTIVE

## 2022-09-04 LAB — HEPATITIS A ANTIBODY, TOTAL: Hepatitis A AB,Total: REACTIVE — AB

## 2022-09-04 LAB — HIV ANTIBODY (ROUTINE TESTING W REFLEX): HIV 1&2 Ab, 4th Generation: REACTIVE — AB

## 2022-09-07 ENCOUNTER — Other Ambulatory Visit (HOSPITAL_COMMUNITY): Payer: Self-pay

## 2022-09-10 NOTE — Progress Notes (Signed)
09/10/2022  HPI: John Rush is a 71 y.o. male who presents to the [facility] clinic for HIV follow-up.  Patient Active Problem List   Diagnosis Date Noted   BACK PAIN 05/01/2009   OTHER PREMATURE BEATS 12/19/2008   FRONTAL SINUSITIS 12/18/2008   HERPES, GENITAL NOS 11/10/2006   TOBACCO USER 11/10/2006   HYPERTENSION 11/10/2006   HIV DISEASE 05/24/2003    Patient's Medications   No medications on file    Allergies: Not on File  Past Medical History: No past medical history on file.  Social History: Social History   Socioeconomic History   Marital status: Married    Spouse name: Not on file   Number of children: Not on file   Years of education: Not on file   Highest education level: Not on file  Occupational History   Not on file  Tobacco Use   Smoking status: Not on file   Smokeless tobacco: Not on file  Substance and Sexual Activity   Alcohol use: Not on file   Drug use: Not on file   Sexual activity: Not on file  Other Topics Concern   Not on file  Social History Narrative   Not on file   Social Determinants of Health   Financial Resource Strain: Not on file  Food Insecurity: Not on file  Transportation Needs: Not on file  Physical Activity: Not on file  Stress: Not on file  Social Connections: Not on file    Labs: Lab Results  Component Value Date   HIV1RNAQUANT <20 DETECTED (A) 08/28/2022   HIV1RNAQUANT <20 copies/mL 12/19/2010   HIV1RNAQUANT <48 copies/mL 02/13/2010   CD4TABS 530 12/19/2010   CD4TABS 430 02/13/2010   CD4TABS 450 04/17/2009    RPR and STI Lab Results  Component Value Date   LABRPR NON-REACTIVE 08/28/2022   LABRPR NON REAC 12/19/2010   LABRPR NON REAC 02/13/2010   LABRPR NON REAC 11/13/2008   LABRPR NON REAC 07/13/2008    STI Results GC CT  08/28/2022 10:01 AM Negative  Negative     Hepatitis B Lab Results  Component Value Date   HEPBSAB NON-REACTIVE 08/28/2022   HEPBSAG NON-REACTIVE 08/28/2022    HEPBCAB NON-REACTIVE 08/28/2022   Hepatitis C Lab Results  Component Value Date   HEPCAB NON-REACTIVE 08/28/2022   Hepatitis A Lab Results  Component Value Date   HAV REACTIVE (A) 08/28/2022   Lipids: Lab Results  Component Value Date   CHOL 200 (H) 08/28/2022   TRIG 156 (H) 08/28/2022   HDL 39 (L) 08/28/2022   CHOLHDL 5.1 (H) 08/28/2022   VLDL 18 12/19/2010   LDLCALC 132 (H) 08/28/2022    Current HIV Regimen: Genvoya  Assessment: John Rush is here for an HIV follow up visit. He is a new patient to [place] and we introduced ourselves and the services we provide to aid in his care. We provided him a card with the clinic contact information and asked for him to call us if he has any issues getting his medication or experiences any severe side effects. He is being switched to Dovato after being on Genvoya for many years. We reviewed all drug information for Dovato with the patient including its high barrier to resistance, the "start-up" syndrome such as headache and stomach upset in the first few weeks, and it having no impact on the kidneys. He will take the medication once a day. He denies takes any antacids and multivitamins after discussing drug drug interactions. He did report  taking zinc, but not in a long while.   Plan: - Stop Genvoya - Start Dovato 50-300mg  PO daily - Follow up on 12/10/2022 at 9:30 AM with Dr. Loraine Grip Otilio Carpen, Student Pharm-D [facility] for Infectious Disease

## 2022-09-11 ENCOUNTER — Other Ambulatory Visit: Payer: Self-pay | Admitting: Pharmacist

## 2022-09-11 ENCOUNTER — Other Ambulatory Visit: Payer: Self-pay

## 2022-09-11 ENCOUNTER — Encounter: Payer: Self-pay | Admitting: Internal Medicine

## 2022-09-11 ENCOUNTER — Ambulatory Visit (INDEPENDENT_AMBULATORY_CARE_PROVIDER_SITE_OTHER): Payer: Medicare HMO | Admitting: Internal Medicine

## 2022-09-11 ENCOUNTER — Ambulatory Visit (INDEPENDENT_AMBULATORY_CARE_PROVIDER_SITE_OTHER): Payer: Medicare HMO | Admitting: Pharmacist

## 2022-09-11 ENCOUNTER — Other Ambulatory Visit (HOSPITAL_COMMUNITY): Payer: Self-pay

## 2022-09-11 VITALS — BP 145/81 | HR 73 | Temp 98.5°F | Ht 71.0 in | Wt 237.0 lb

## 2022-09-11 DIAGNOSIS — Z8546 Personal history of malignant neoplasm of prostate: Secondary | ICD-10-CM | POA: Diagnosis not present

## 2022-09-11 DIAGNOSIS — B20 Human immunodeficiency virus [HIV] disease: Secondary | ICD-10-CM

## 2022-09-11 MED ORDER — DOLUTEGRAVIR-LAMIVUDINE 50-300 MG PO TABS: 1.0000 | ORAL_TABLET | Freq: Every day | ORAL | 11 refills | Status: DC

## 2022-09-11 NOTE — Patient Instructions (Addendum)
You are doing well on genvoya. But as you start to take other medication which you'll soon likely to be, I advise switch to dovato (less drug interaction and less stress on the kidney and the bone density)   Also because you are a male and have hiv, you are at much higher risk for stroke/heart attack. Study had proven than by taking cholesterol medication your risk will be at least 30% lower.   You can go and review this study "reprieve -- hiv" on google and it will give you more information. I'll start you on lipitor once you are off genovya (this can increase the level of lipitor and cause side effect)   Continue genovya for now until you receive dovato from mail. Then start taking dovato and stop genvoya   See me again in 3 months. Will do labs after visit and will talk more about the lipitor   Also ask your pcp to refer you to urology for ongoing monitoring of prostate cancer. Your psa was high recently

## 2022-09-11 NOTE — Progress Notes (Signed)
[facility] for Infectious Disease  Reason for Consult:hiv patient here to establish care in a new clinic Referring Provider: [facility]    Patient Active Problem List   Diagnosis Date Noted   BACK PAIN 05/01/2009   OTHER PREMATURE BEATS 12/19/2008   FRONTAL SINUSITIS 12/18/2008   HERPES, GENITAL NOS 11/10/2006   TOBACCO USER 11/10/2006   HYPERTENSION 11/10/2006   HIV DISEASE 05/24/2003      HPI: John Rush is a 71 y.o. male controlled hiv here to reestablish care  I first see him today 09/11/22; reviewed chart from [facility] and our [facility] chart on epic #hiv Dx'ed 1990s; heterosexual; got it from wife OI 12/17/2016 pjp; cd4 70s nadir Therapy;  -started on atripla in 2006/12/17 -went off ART in Dec 17, 2010 as he thought he was cured -restarted on genvoya in 2016/12/17 after admissions for [facility] pneumonia  Patient has been compliant with genvoya since starting in 17-Dec-2016. He is immunologically and virologically controlled. Compliant -- no missed dose last 4 weeks. He doesn't want to change meds ART right now "what ain't broke doesn't need fixing."  No f/c No n/v/diarrhea No rash No headache No chest pain/dyspnea/cough   He has a pcp at [facility] (nurse practitioner).     #no hx syphilis  #social -born/raised in [place]. He stayed in [place] 14 years where he met his wife (hiv/aids who had passed away 2000/12/17). They have a biological son who is not infected (wife on art during pregnancy) -travel prior: [place], [place]. Out of the country to [place] for half a day -no smoking, etoh, substance use. 2016-12-17 was his last cigarette -retired from [employer]. Was a Theme park manager since 17. 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 His last psa 06/2022 was 12. He is not currently seeing urology    Review of  Systems: ROS  All other ros negative     No past medical history on file.  Social History   Tobacco Use   Smoking status: Former    Types: Cigarettes   Smokeless tobacco: Never  Substance Use Topics   Alcohol use: Not Currently   Drug use: Not Currently    Types: Marijuana, "Crack" cocaine    No family history on file.  No Known Allergies  OBJECTIVE: Vitals:   09/11/22 0845  BP: (!) 153/84  Pulse: 82  Temp: 98.5 F (36.9 C)  TempSrc: Oral  SpO2: 96%  Weight: 237 lb (107.5 kg)  Height: _0  (1.803 m)   Body mass index is 33.05 kg/m.   Physical Exam General/constitutional: no distress, pleasant, obese HEENT: Normocephalic, PER, Conj Clear, EOMI, Oropharynx clear Neck supple CV: rrr no mrg Lungs: clear to auscultation, normal respiratory effort Abd: Soft, Nontender Ext: no edema Skin: No Rash Neuro: nonfocal MSK: no peripheral joint swelling/tenderness/warmth; back spines nontender   Lab:  HIV: 06/2022 ([facility])         ud     /     527 (28%) 02/2022 ([facility])    ud     /     420s (25%)  Microbiology:  Serology: [facility] record 10/2021  hep a ab positive; hep b sAb nonreactive; hep c ab nonreactive  Imaging:   Assessment/plan: Problem List Items Addressed This Visit   None  #hiv Will switch him from genvoya to dovato He has  SPAP (supplemental to [facility]) He desires dovato to be mailed to him    -discussed u=u -encourage compliance -continue current HIV medication genvoya until receives dovato -labs 3 months -f/u in 3 months   #CAD risk Discussed reprieve study He is willing to try statin, but as he is on genvoya now, I would wait until he transition to dovato before starting him on 40 mg lipitor   #prostate cancer/elevated psa Advise patient to get urology referral from pcp Per pcp chart gleason score 6 biosied 09/2019 -- no treatment Last psa 06/2022 from her pcp showed level 12's  #hcm -vaccination Refused recent prevnar offered  08/27/2022 -hepatitis Will recheck and attempt repeat heplisav if hep b sAb nonreactive -std screening No risk; will send next year -tb screening Previous quantiferon gold negative 2023 [facility] -cancer screening Defer to pcp    I have spent a total of 65 minutes of face-to-face and non-face-to-face time, excluding clinical staff time, preparing to see patient, ordering tests and/or medications, and provide counseling the patient      Follow-up: Return in about 3 months (around 12/12/2022).  Jabier Mutton, [facility] for Infectious Disease [facility] 09/11/2022, 9:04 AM

## 2022-09-16 ENCOUNTER — Other Ambulatory Visit: Payer: Self-pay

## 2022-09-16 DIAGNOSIS — B20 Human immunodeficiency virus [HIV] disease: Secondary | ICD-10-CM

## 2022-09-16 MED ORDER — DOLUTEGRAVIR-LAMIVUDINE 50-300 MG PO TABS: 1.0000 | ORAL_TABLET | Freq: Every day | ORAL | 11 refills | Status: DC

## 2022-09-22 ENCOUNTER — Other Ambulatory Visit: Payer: Self-pay

## 2022-09-22 DIAGNOSIS — B20 Human immunodeficiency virus [HIV] disease: Secondary | ICD-10-CM

## 2022-09-22 MED ORDER — DOLUTEGRAVIR-LAMIVUDINE 50-300 MG PO TABS: 1.0000 | ORAL_TABLET | Freq: Every day | ORAL | 11 refills | Status: DC

## 2022-09-23 ENCOUNTER — Other Ambulatory Visit (HOSPITAL_COMMUNITY): Payer: Self-pay

## 2022-10-06 ENCOUNTER — Other Ambulatory Visit (HOSPITAL_COMMUNITY): Payer: Self-pay

## 2022-12-10 ENCOUNTER — Ambulatory Visit (INDEPENDENT_AMBULATORY_CARE_PROVIDER_SITE_OTHER): Payer: Medicare HMO | Admitting: Internal Medicine

## 2022-12-10 ENCOUNTER — Encounter: Payer: Self-pay | Admitting: Internal Medicine

## 2022-12-10 ENCOUNTER — Other Ambulatory Visit: Payer: Self-pay

## 2022-12-10 VITALS — BP 162/75 | HR 82 | Temp 98.3°F | Resp 16 | Ht 73.0 in | Wt 238.0 lb

## 2022-12-10 DIAGNOSIS — B20 Human immunodeficiency virus [HIV] disease: Secondary | ICD-10-CM

## 2022-12-10 NOTE — Patient Instructions (Signed)
Continue dovato  Labs today   Please think about cholesterol medication. I would advise taking this. You can look up Reprieve trial for hiv which proves that this medication is beneficial in reducing heart attack/stroke in patient living with hiv

## 2022-12-10 NOTE — Progress Notes (Signed)
[name]  Cc - hiv care f/u    Patient Active Problem List   Diagnosis Date Noted   BACK PAIN 05/01/2009   OTHER PREMATURE BEATS 12/19/2008   FRONTAL SINUSITIS 12/18/2008   HERPES, GENITAL NOS 11/10/2006   TOBACCO USER 11/10/2006   HYPERTENSION 11/10/2006   HIV DISEASE 05/24/2003      HPI: John Rush is a 72 y.o. male controlled hiv here for ongoing care  12/10/22 id clinic f/u Due for labs today We switched him from genvoya to dovato 09/11/22 No missed dose last 4 weeks. No side effect from dovato No new medication since last visit No concern in his health today He asked about his blood pressure today which was a little high and he thought the cuff was small and caused his bp to go high. He has no hypertension problem  No fever, nightsweat, weight loss, decreased appetite No rash, cough, diarrhea/bloody-black stool Chronic intermittent bilateral knee pain    ------------ I first see him 09/11/22; reviewed chart from [facility] and our rc[facility] clinic chart on epic #hiv Dx'ed 1990s; heterosexual; got it from wife OI 01-10-2017 pjp; cd4 70s nadir Therapy;  -started on atripla in Jan 11, 2007 -went off ART in 01-11-11 as he thought he was cured -restarted on genvoya in 01-10-2017 after admissions for PJP pneumonia  Patient has been compliant with genvoya since starting in 10-Jan-2017. He is immunologically and virologically controlled. Compliant -- no missed dose last 4 weeks. He doesn't want to change meds ART right now "what ain't broke doesn't need fixing."  No f/c No n/v/diarrhea No rash No headache No chest pain/dyspnea/cough   He has a pcp at [facility] (nurse practitioner).     #no hx syphilis  #social -born/raised in [place], [place]. He stayed in [place] 14 years where he met his wife (hiv/aids who had passed away 01/10/2001). They have a biological son who is not infected (wife on art during pregnancy) -travel prior:  [place], [place]. Out of the country to [place] for half a day -no smoking, etoh, substance use. January 10, 2017 was his last cigarette -retired from [employer]. Was a Theme park manager since 42. 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 His last psa 06/2022 was 12. He is not currently seeing urology    Review of Systems: ROS  All other ros negative     No past medical history on file.  Social History   Tobacco Use   Smoking status: Former    Types: Cigarettes   Smokeless tobacco: Never  Substance Use Topics   Alcohol use: Not Currently   Drug use: Not Currently    Types: Marijuana, "Crack" cocaine    No family history on file.  No Known Allergies  OBJECTIVE: Vitals:   12/10/22 0901  BP: (!) 161/81  Pulse: 82  Resp: 16  Temp: 98.3 F (36.8 C)  TempSrc: Oral  SpO2: 98%  Weight: 238 lb (108 kg)  Height: 6' 1"$  (1.854 m)   Body mass index is 31.4 kg/m.   Physical Exam General/constitutional: no distress, pleasant HEENT: Normocephalic, PER, Conj Clear, EOMI, Oropharynx clear Neck supple CV: rrr no mrg Lungs: clear to auscultation, normal respiratory effort Abd: Soft, Nontender Ext: no edema Skin: No Rash Neuro: nonfocal MSK: no peripheral joint swelling/tenderness/warmth; back spines nontender     Lab:  HIV:  06/2022 ([facility])         ud     /     527 (28%) 02/2022 ([facility])    ud     /     420s (25%)  Microbiology:  Serology: [place] record 10/2021  hep a ab positive; hep b sAb nonreactive; hep c ab nonreactive  Imaging:   Assessment/plan: Problem List Items Addressed This Visit   None Visit Diagnoses     HIV disease ([facility])    -  Primary   Relevant Orders   HIV 1 RNA quant-no reflex-bld   CBC w/Diff   COMPLETE METABOLIC PANEL WITH GFR   T-helper cells (CD4) count      #hiv Will switch him from genvoya to dovato He has  SPAP (supplemental to [place]) He desires dovato to be mailed to him  Started dovato 08/2022. Likes it. compliant     -discussed u=u -encourage compliance -continue current HIV medication dovato -labs today -f/u in 6 months    #CAD risk Discussed reprieve study He is willing to try statin, but as he is on genvoya now, I would wait until he transition to dovato before starting him on 40 mg lipitor  Discuss reprieve study again 11/2022... patient wants to think about it   #prostate cancer/elevated psa Advise patient to get urology referral from pcp Per pcp chart gleason score 6 biosied 09/2019 -- no treatment Last psa 06/2022 from her pcp showed level 12's  -f/u pcp  #hcm -vaccination Refused recent prevnar offered 08/27/2022 -hepatitis No risk deferred hep b repeat testing -std screening No risk patient wants to defer -tb screening quantiferon gold negative 2023 [facility] -cancer screening Done by pcp -pcp [facility] FIT test last year normal Psa again was a little high 06/2022 -- followed by pcp; hx prostate cancer not currently followed by urology    I have spent a total of 65 minutes of face-to-face and non-face-to-face time, excluding clinical staff time, preparing to see patient, ordering tests and/or medications, and provide counseling the patient      Follow-up: No follow-ups on file.  Jabier Mutton, [facility] 12/10/2022, 9:53 AM

## 2022-12-11 LAB — T-HELPER CELLS (CD4) COUNT (NOT AT ARMC)
CD4 % Helper T Cell: 26 % — ABNORMAL LOW (ref 33–65)
CD4 T Cell Abs: 561 /uL (ref 400–1790)

## 2022-12-13 LAB — COMPLETE METABOLIC PANEL WITH GFR
AG Ratio: 1.6 (calc) (ref 1.0–2.5)
ALT: 17 U/L (ref 9–46)
AST: 20 U/L (ref 10–35)
Albumin: 4.6 g/dL (ref 3.6–5.1)
Alkaline phosphatase (APISO): 79 U/L (ref 35–144)
BUN: 9 mg/dL (ref 7–25)
CO2: 24 mmol/L (ref 20–32)
Calcium: 10.4 mg/dL — ABNORMAL HIGH (ref 8.6–10.3)
Chloride: 105 mmol/L (ref 98–110)
Creat: 1.01 mg/dL (ref 0.70–1.35)
Globulin: 2.9 g/dL (calc) (ref 1.9–3.7)
Glucose, Bld: 90 mg/dL (ref 65–99)
Potassium: 4.2 mmol/L (ref 3.5–5.3)
Sodium: 140 mmol/L (ref 135–146)
Total Bilirubin: 0.4 mg/dL (ref 0.2–1.2)
Total Protein: 7.5 g/dL (ref 6.1–8.1)
eGFR: 81 mL/min/{1.73_m2} (ref >=60)

## 2022-12-13 LAB — HIV-1 RNA QUANT-NO REFLEX-BLD
HIV 1 RNA Quant: <20 Copies/mL — ABNORMAL HIGH
HIV-1 RNA Quant, Log: <1.3 Log cps/mL — ABNORMAL HIGH

## 2022-12-13 LAB — CBC WITH DIFFERENTIAL/PLATELET
Absolute Monocytes: 308 cells/uL (ref 200–950)
Basophils Absolute: 70 cells/uL (ref 0–200)
Basophils Relative: 1.7 %
Eosinophils Absolute: 111 cells/uL (ref 15–500)
Eosinophils Relative: 2.7 %
HCT: 45.1 % (ref 38.5–50.0)
Hemoglobin: 15.5 g/dL (ref 13.2–17.1)
Lymphs Abs: 2079 cells/uL (ref 850–3900)
MCH: 33.3 pg — ABNORMAL HIGH (ref 27.0–33.0)
MCHC: 34.4 g/dL (ref 32.0–36.0)
MCV: 96.8 fL (ref 80.0–100.0)
MPV: 10.3 fL (ref 7.5–12.5)
Monocytes Relative: 7.5 %
Neutro Abs: 1533 cells/uL (ref 1500–7800)
Neutrophils Relative %: 37.4 %
Platelets: 322 10*3/uL (ref 140–400)
RBC: 4.66 10*6/uL (ref 4.20–5.80)
RDW: 13.3 % (ref 11.0–15.0)
Total Lymphocyte: 50.7 %
WBC: 4.1 10*3/uL (ref 3.8–10.8)

## 2023-08-16 ENCOUNTER — Other Ambulatory Visit: Payer: Self-pay

## 2023-08-16 DIAGNOSIS — B20 Human immunodeficiency virus [HIV] disease: Secondary | ICD-10-CM

## 2023-08-16 MED ORDER — DOLUTEGRAVIR-LAMIVUDINE 50-300 MG PO TABS: 1.0000 | ORAL_TABLET | Freq: Every day | ORAL | 1 refills | Status: DC

## 2023-09-03 ENCOUNTER — Other Ambulatory Visit: Payer: Self-pay

## 2023-09-03 ENCOUNTER — Ambulatory Visit (INDEPENDENT_AMBULATORY_CARE_PROVIDER_SITE_OTHER): Payer: Medicare HMO | Admitting: Internal Medicine

## 2023-09-03 ENCOUNTER — Encounter: Payer: Self-pay | Admitting: Internal Medicine

## 2023-09-03 VITALS — BP 144/79 | HR 98 | Resp 16 | Ht 73.0 in | Wt 240.0 lb

## 2023-09-03 DIAGNOSIS — B20 Human immunodeficiency virus [HIV] disease: Secondary | ICD-10-CM | POA: Diagnosis not present

## 2023-09-03 NOTE — Addendum Note (Signed)
Addended by: Rutha Bouchard T on: 09/03/2023 11:19 AM   Modules accepted: Orders

## 2023-09-03 NOTE — Progress Notes (Signed)
[facility]  Cc - hiv care f/u    Patient Active Problem List   Diagnosis Date Noted   Backache 05/01/2009   OTHER PREMATURE BEATS 12/19/2008   FRONTAL SINUSITIS 12/18/2008   Genital herpes 11/10/2006   TOBACCO USER 11/10/2006   Essential hypertension 11/10/2006   HIV DISEASE 05/24/2003      HPI: John Rush is a 72 y.o. male controlled hiv here for ongoing care   09/03/23 id clinic f/u Doing well on dovato no missed dose last 4 weeks No complaint today    12/10/22 id clinic f/u Due for labs today We switched him from genvoya to dovato 09/11/22 No missed dose last 4 weeks. No side effect from dovato No new medication since last visit No concern in his health today He asked about his blood pressure today which was a little high and he thought the cuff was small and caused his bp to go high. He has no hypertension problem  No fever, nightsweat, weight loss, decreased appetite No rash, cough, diarrhea/bloody-black stool Chronic intermittent bilateral knee pain    ------------ I first see him 09/11/22; [facility] and our rc[facility] clinic chart on epic #hiv Dx'ed 1990s; heterosexual; got it from wife OI 10/11/2017 pjp; cd4 70s nadir Therapy;  -started on atripla in 2007-10-12 -went off ART in 2011/10/12 as he thought he was cured -restarted on genvoya in October 11, 2017 after admissions for PJP pneumonia  Patient has been compliant with genvoya since starting in 2017-10-11. He is immunologically and virologically controlled. Compliant -- no missed dose last 4 weeks. He doesn't want to change meds ART right now "what ain't broke doesn't need fixing."  No f/c No n/v/diarrhea No rash No headache No chest pain/dyspnea/cough   He has a pcp at [facility] (nurse practitioner).     #no hx syphilis  #social -born/raised in [place], [place]. He stayed in [place] 14 years where he met his wife (hiv/aids who had passed away  2001-10-11). They have a biological son who is not infected (wife on art during pregnancy) -travel prior: [place], [place]. Out of the country to [place] for half a day -no smoking, etoh, substance use. October 11, 2017 was his last cigarette -retired from [employer]. 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 His last psa 06/2022 was 12. He is not currently seeing urology    Review of Systems: ROS  All other ros negative     No past medical history on file.  Social History   Tobacco Use   Smoking status: Former    Types: Cigarettes   Smokeless tobacco: Never  Substance Use Topics   Alcohol use: Not Currently   Drug use: Not Currently    Types: Marijuana, "Crack" cocaine    No family history on file.  No Known Allergies  OBJECTIVE: There were no vitals filed for this visit.  There is no height or weight on file to calculate BMI.   Physical Exam General/constitutional: no distress, pleasant HEENT: Normocephalic, PER, Conj Clear, EOMI, Oropharynx clear Neck supple CV: rrr no mrg Lungs: clear to auscultation, normal respiratory effort Abd: Soft, Nontender Ext: no edema Skin: No Rash Neuro: nonfocal MSK: no peripheral joint swelling/tenderness/warmth; back spines nontender     Lab: Lab Results  Component Value Date   WBC  4.1 12/10/2022   HGB 15.5 12/10/2022   HCT 45.1 12/10/2022   MCV 96.8 12/10/2022   PLT 322 12/10/2022   Last metabolic panel Lab Results  Component Value Date   GLUCOSE 90 12/10/2022   NA 140 12/10/2022   K 4.2 12/10/2022   CL 105 12/10/2022   CO2 24 12/10/2022   BUN 9 12/10/2022   CREATININE 1.01 12/10/2022   EGFR 81 12/10/2022   CALCIUM 10.4 (H) 12/10/2022   PROT 7.5 12/10/2022   ALBUMIN 4.1 12/19/2010   BILITOT 0.4 12/10/2022   ALKPHOS 93 12/19/2010   AST 20 12/10/2022   ALT 17  12/10/2022    HIV: 11/2022                   <20   /     561  (21%) 06/2022 ([facility])         ud     /     527 (28%) 02/2022 ([facility])    ud     /     420s (25%)  Microbiology:  Serology: [facility] record 10/2021  hep a ab positive; hep b sAb nonreactive; hep c ab nonreactive  Imaging:   Assessment/plan: Problem List Items Addressed This Visit   None Visit Diagnoses     HIV disease ([facility])    -  Primary       #hiv Will switch him from genvoya to dovato He has SPAP (supplemental to [place]) He desires dovato to be mailed to him  Started dovato 08/2022. Likes it. compliant     -discussed u=u -encourage compliance -continue current HIV medication dovato -labs today -f/u in 6 months  -we discussed reprieve trial again; I encourage patient to think about starting statin    #hcm -vaccination Doesn't want any vaccine -hepatitis No risk deferred hep b repeat testing -std screening No risk patient wants to defer -tb screening quantiferon gold negative [facility]23 [facility] -cancer screening Done by pcp -pcp [facility]  FIT test 2022 normal Psa again was a little high 06/2022 -- followed by pcp; hx prostate cancer not currently followed by urology       Follow-up: Return in about 6 months (around 03/02/2024).  Raymondo Band, MD [facility] 09/03/2023, 10:52 AM

## 2023-09-03 NOTE — Patient Instructions (Signed)
Please google "reprieve nejm hiv"  This talks about statin medication to reduce risk of heart attack/stroke   Labs today  Continue dovato  You can think about injectable hiv medication cabenuva and let me know what you think   See me in 6 months   Follow up with your pcp for ongoing age-appropriate

## 2023-09-07 LAB — HIV-1 RNA QUANT-NO REFLEX-BLD
HIV 1 RNA Quant: NOT DETECTED {copies}/mL
HIV-1 RNA Quant, Log: NOT DETECTED {Log_copies}/mL

## 2023-10-04 ENCOUNTER — Other Ambulatory Visit: Payer: Self-pay

## 2023-10-04 DIAGNOSIS — B20 Human immunodeficiency virus [HIV] disease: Secondary | ICD-10-CM

## 2023-10-04 MED ORDER — DOLUTEGRAVIR-LAMIVUDINE 50-300 MG PO TABS: 1.0000 | ORAL_TABLET | Freq: Every day | ORAL | 3 refills | Status: DC

## 2023-11-19 ENCOUNTER — Emergency Department (HOSPITAL_COMMUNITY): Payer: HMO

## 2023-11-19 ENCOUNTER — Emergency Department (HOSPITAL_COMMUNITY)
Admission: EM | Admit: 2023-11-19 | Discharge: 2023-11-19 | Disposition: A | Discharge status: Home / Self Care | Payer: HMO | Attending: Emergency Medicine | Admitting: Emergency Medicine

## 2023-11-19 ENCOUNTER — Other Ambulatory Visit: Payer: Self-pay

## 2023-11-19 DIAGNOSIS — I10 Essential (primary) hypertension: Secondary | ICD-10-CM | POA: Diagnosis not present

## 2023-11-19 DIAGNOSIS — Z5329 Procedure and treatment not carried out because of patient's decision for other reasons: Secondary | ICD-10-CM | POA: Diagnosis not present

## 2023-11-19 DIAGNOSIS — Z21 Asymptomatic human immunodeficiency virus [HIV] infection status: Secondary | ICD-10-CM | POA: Diagnosis not present

## 2023-11-19 DIAGNOSIS — R509 Fever, unspecified: Secondary | ICD-10-CM | POA: Diagnosis not present

## 2023-11-19 DIAGNOSIS — R000 Tachycardia, unspecified: Secondary | ICD-10-CM | POA: Diagnosis not present

## 2023-11-19 DIAGNOSIS — R531 Weakness: Secondary | ICD-10-CM | POA: Diagnosis not present

## 2023-11-19 DIAGNOSIS — R0989 Other specified symptoms and signs involving the circulatory and respiratory systems: Secondary | ICD-10-CM | POA: Diagnosis not present

## 2023-11-19 LAB — COMPREHENSIVE METABOLIC PANEL
ALT: 28 U/L (ref 0–44)
AST: 48 U/L — ABNORMAL HIGH (ref 15–41)
Albumin: 4.1 g/dL (ref 3.5–5.0)
Alkaline Phosphatase: 63 U/L (ref 38–126)
Anion gap: 13 (ref 5–15)
BUN: 11 mg/dL (ref 8–23)
CO2: 20 mmol/L — ABNORMAL LOW (ref 22–32)
Calcium: 9.2 mg/dL (ref 8.9–10.3)
Chloride: 102 mmol/L (ref 98–111)
Creatinine, Ser: 1.32 mg/dL — ABNORMAL HIGH (ref 0.61–1.24)
GFR, Estimated: 58 mL/min — ABNORMAL LOW (ref >60)
Glucose, Bld: 95 mg/dL (ref 70–99)
Potassium: 3.8 mmol/L (ref 3.5–5.1)
Sodium: 135 mmol/L (ref 135–145)
Total Bilirubin: 0.7 mg/dL (ref 0.0–1.2)
Total Protein: 7.7 g/dL (ref 6.5–8.1)

## 2023-11-19 LAB — URINALYSIS, W/ REFLEX TO CULTURE (INFECTION SUSPECTED)
Bilirubin Urine: NEGATIVE
Glucose, UA: NEGATIVE mg/dL
Ketones, ur: 5 mg/dL — AB
Leukocytes,Ua: NEGATIVE
Nitrite: NEGATIVE
Protein, ur: 30 mg/dL — AB
Specific Gravity, Urine: 1.02 (ref 1.005–1.030)
pH: 5 (ref 5.0–8.0)

## 2023-11-19 LAB — CBC WITH DIFFERENTIAL/PLATELET
Abs Immature Granulocytes: 0.03 10*3/uL (ref 0.00–0.07)
Basophils Absolute: 0 10*3/uL (ref 0.0–0.1)
Basophils Relative: 1 %
Eosinophils Absolute: 0.2 10*3/uL (ref 0.0–0.5)
Eosinophils Relative: 3 %
HCT: 39.4 % (ref 39.0–52.0)
Hemoglobin: 13.2 g/dL (ref 13.0–17.0)
Immature Granulocytes: 0 %
Lymphocytes Relative: 15 %
Lymphs Abs: 1 10*3/uL (ref 0.7–4.0)
MCH: 33.7 pg (ref 26.0–34.0)
MCHC: 33.5 g/dL (ref 30.0–36.0)
MCV: 100.5 fL — ABNORMAL HIGH (ref 80.0–100.0)
Monocytes Absolute: 0.7 10*3/uL (ref 0.1–1.0)
Monocytes Relative: 10 %
Neutro Abs: 4.8 10*3/uL (ref 1.7–7.7)
Neutrophils Relative %: 71 %
Platelets: 222 10*3/uL (ref 150–400)
RBC: 3.92 MIL/uL — ABNORMAL LOW (ref 4.22–5.81)
RDW: 13.8 % (ref 11.5–15.5)
WBC: 6.8 10*3/uL (ref 4.0–10.5)
nRBC: 0 % (ref 0.0–0.2)

## 2023-11-19 LAB — I-STAT CG4 LACTIC ACID, ED
Lactic Acid, Venous: 2 mmol/L (ref 0.5–1.9)
Lactic Acid, Venous: 1.5 mmol/L (ref 0.5–1.9)

## 2023-11-19 LAB — PROTIME-INR
INR: 1.2 (ref 0.8–1.2)
Prothrombin Time: 15.7 s — ABNORMAL HIGH (ref 11.4–15.2)

## 2023-11-19 MED ORDER — SODIUM CHLORIDE 0.9 % IV BOLUS (SEPSIS)
1000.0000 mL | Freq: Once | INTRAVENOUS | Status: AC
  Administered 2023-11-19: 1000 mL via INTRAVENOUS

## 2023-11-19 MED ORDER — VANCOMYCIN HCL IN DEXTROSE 1-5 GM/200ML-% IV SOLN: 1000.0000 mg | Freq: Once | INTRAVENOUS | Status: DC

## 2023-11-19 MED ORDER — VANCOMYCIN HCL 2000 MG/400ML IV SOLN
2000.0000 mg | Freq: Once | INTRAVENOUS | Status: DC
  Filled 2023-11-19: qty 400

## 2023-11-19 MED ORDER — METRONIDAZOLE 500 MG/100ML IV SOLN
500.0000 mg | Freq: Once | INTRAVENOUS | Status: AC
  Administered 2023-11-19: 500 mg via INTRAVENOUS
  Filled 2023-11-19: qty 100

## 2023-11-19 MED ORDER — SODIUM CHLORIDE 0.9 % IV SOLN
2.0000 g | Freq: Once | INTRAVENOUS | Status: AC
  Administered 2023-11-19: 2 g via INTRAVENOUS
  Filled 2023-11-19: qty 12.5

## 2023-11-19 MED ORDER — ACETAMINOPHEN 500 MG PO TABS
1000.0000 mg | ORAL_TABLET | Freq: Once | ORAL | Status: AC
  Administered 2023-11-19: 1000 mg via ORAL
  Filled 2023-11-19: qty 2

## 2023-11-19 NOTE — ED Notes (Signed)
Patient educated about importance of respiratory panel for disposition; patient continues to refuse swab

## 2023-11-19 NOTE — Sepsis Progress Note (Signed)
Elink monitoring for the code sepsis protocol.

## 2023-11-19 NOTE — ED Provider Notes (Signed)
[place] EMERGENCY DEPARTMENT AT [facility] Provider Note   CSN: [id number] Arrival date & time: 11/19/23  1919     History  Chief Complaint  Patient presents with   Weakness    John Rush is a 73 y.o. male.  Pt is a 73 yo male with pmhx significant for HIV (non-detectable).  Pt presents to the ED today with fever and foul smelling urine. Pt denies dysuria.  Pt has had a cough.  He took tylenol at 1300, but none since.  Pt denies any pain.       Home Medications Prior to Admission medications   Medication Sig Start Date End Date Taking? Authorizing Provider  dolutegravir-lamiVUDine (DOVATO) 50-300 MG tablet Take 1 tablet by mouth daily. 10/04/23 09/28/24  Vu, Tonita Phoenix, MD  Fish Oil-Cholecalciferol (FISH OIL + D3) 1000-1000 MG-UNIT CAPS Take by mouth.    [provider]  Flaxseed, Linseed, (FLAXSEED OIL) 1000 MG CAPS Take by mouth.    [provider]  GINSENG PO Take by mouth.    [provider]  Misc Natural Products (ELDERBERRY/VITAMIN C/ZINC PO) Take by mouth.    [provider]  Multiple Vitamin (MULTIVITAMIN ADULT PO) Take by mouth.    [provider]  Multiple Vitamins-Minerals (ANTIOXIDANT PO) Take by mouth.    [provider]  niacin (VITAMIN B3) 500 MG tablet Take 500 mg by mouth at bedtime.    [provider]      Allergies    Patient has no known allergies.    Review of Systems   Review of Systems  Constitutional:  Positive for fever.  Respiratory:  Positive for cough.   All other systems reviewed and are negative.   Physical Exam Updated Vital Signs BP 139/81   Pulse 86   Temp 98.8 F (37.1 C) (Oral)   Resp 15   Ht 6' (1.829 m)   Wt 109 kg   SpO2 94%   BMI 32.59 kg/m  Physical Exam Vitals and nursing note reviewed.  Constitutional:      Appearance: Normal appearance.  HENT:     Head: Normocephalic and atraumatic.     Right Ear: External ear normal.     Left Ear:  External ear normal.     Nose: Nose normal.     Mouth/Throat:     Mouth: Mucous membranes are dry.  Eyes:     Extraocular Movements: Extraocular movements intact.     Conjunctiva/sclera: Conjunctivae normal.     Pupils: Pupils are equal, round, and reactive to light.  Cardiovascular:     Rate and Rhythm: Regular rhythm. Tachycardia present.     Pulses: Normal pulses.     Heart sounds: Normal heart sounds.  Pulmonary:     Effort: Pulmonary effort is normal.     Breath sounds: Normal breath sounds.  Abdominal:     General: Abdomen is flat. Bowel sounds are normal.     Palpations: Abdomen is soft.  Musculoskeletal:        General: Normal range of motion.     Cervical back: Normal range of motion and neck supple.  Skin:    General: Skin is warm.     Capillary Refill: Capillary refill takes less than 2 seconds.  Neurological:     General: No focal deficit present.     Mental Status: He is alert and oriented to person, place, and time.  Psychiatric:        Mood and Affect: Mood  normal.        Behavior: Behavior normal.     ED Results / Procedures / Treatments   Labs (all labs ordered are listed, but only abnormal results are displayed) Labs Reviewed  COMPREHENSIVE METABOLIC PANEL - Abnormal; Notable for the following components:      Result Value   CO2 20 (*)    Creatinine, Ser 1.32 (*)    AST 48 (*)    GFR, Estimated 58 (*)    All other components within normal limits  URINALYSIS, W/ REFLEX TO CULTURE (INFECTION SUSPECTED) - Abnormal; Notable for the following components:   Hgb urine dipstick MODERATE (*)    Ketones, ur 5 (*)    Protein, ur 30 (*)    Bacteria, UA RARE (*)    All other components within normal limits  CBC WITH DIFFERENTIAL/PLATELET - Abnormal; Notable for the following components:   RBC 3.92 (*)    MCV 100.5 (*)    All other components within normal limits  PROTIME-INR - Abnormal; Notable for the following components:   Prothrombin Time 15.7 (*)     All other components within normal limits  I-STAT CG4 LACTIC ACID, ED - Abnormal; Notable for the following components:   Lactic Acid, Venous 2.0 (*)    All other components within normal limits  RESP PANEL BY RT-PCR (RSV, FLU A&B, COVID)  RVPGX2  CULTURE, BLOOD (ROUTINE X 2)  CULTURE, BLOOD (ROUTINE X 2)  CBC WITH DIFFERENTIAL/PLATELET  I-STAT CG4 LACTIC ACID, ED    EKG None  Radiology DG Chest Port 1 View Result Date: 11/19/2023 CLINICAL DATA:  Possible sepsis EXAM: PORTABLE CHEST 1 VIEW COMPARISON:  None Available. FINDINGS: Metallic densities over the left chest and shoulder. Low lung volumes. No consolidative airspace opacity. Upper normal cardiac silhouette. No pleural effusion or pneumothorax IMPRESSION: Low lung volumes. No active disease. Electronically Signed   By: Jasmine Pang M.D.   On: 11/19/2023 19:59    Procedures Procedures    Medications Ordered in ED Medications  vancomycin (VANCOREADY) IVPB 2000 mg/400 mL (2,000 mg Intravenous Not Given 11/19/23 2257)  sodium chloride 0.9 % bolus 1,000 mL (0 mLs Intravenous Stopped 11/19/23 2257)  ceFEPIme (MAXIPIME) 2 g in sodium chloride 0.9 % 100 mL IVPB (0 g Intravenous Stopped 11/19/23 2153)  metroNIDAZOLE (FLAGYL) IVPB 500 mg (0 mg Intravenous Stopped 11/19/23 2257)  acetaminophen (TYLENOL) tablet 1,000 mg (1,000 mg Oral Given 11/19/23 1959)    ED Course/ Medical Decision Making/ A&P                                 Medical Decision Making Amount and/or Complexity of Data Reviewed Labs: ordered. Radiology: ordered.  Risk OTC drugs. Prescription drug management.   This patient presents to the ED for concern of sepsis, this involves an extensive number of treatment options, and is a complaint that carries with it a high risk of complications and morbidity.  The differential diagnosis includes sepsis, sirs, infection   Co morbidities that complicate the patient evaluation  HIV   Additional history  obtained:  Additional history obtained from epic chart review External records from outside source obtained and reviewed including family   Lab Tests:  I Ordered, and personally interpreted labs.  The pertinent results include:  lactic 2.0; ua + ketones, cmp with cr 1.32, cbc nl   Imaging Studies ordered:  I ordered imaging studies including cxr  I independently  visualized and interpreted imaging which showed Low lung volumes. No active disease.  I agree with the radiologist interpretation   Cardiac Monitoring:  The patient was maintained on a cardiac monitor.  I personally viewed and interpreted the cardiac monitored which showed an underlying rhythm of: nsr   Medicines ordered and prescription drug management:  I ordered medication including maxipime, vancomycin, flagyl  for sepsis  Reevaluation of the patient after these medicines showed that the patient improved I have reviewed the patients home medicines and have made adjustments as needed   Test Considered:  Ct, covid/flu/rsv   Critical Interventions:  Abx/fluids   Problem List / ED Course:  Fever with possible sepsis:  pt has refused covid/flu/rsv swab.  He does not believe in it and is concerned about the side effects of the swab.  Pt has also refused the vancomycin and ct scans to eval for sepsis.  He said he feels a lot better with his fever down.  The nurse and I tried to convince him to get the tests, but he said he wants to go.  He is aware he needs to sign out AMA.  He is welcome to return at any time for worsening of sx.  His wife and son were with him for these discussions.     Reevaluation:  After the interventions noted above, I reevaluated the patient and found that they have :improved   Social Determinants of Health:  Lives at home   Dispostion:  After consideration of the diagnostic results and the patients response to treatment, I feel that the patent would benefit from [facility].    CRITICAL  CARE Performed by: Jacalyn Lefevre   Total critical care time: 30 minutes  Critical care time was exclusive of separately billable procedures and treating other patients.  Critical care was necessary to treat or prevent imminent or life-threatening deterioration.  Critical care was time spent personally by me on the following activities: development of treatment plan with patient and/or surrogate as well as nursing, discussions with consultants, evaluation of patient's response to treatment, examination of patient, obtaining history from patient or surrogate, ordering and performing treatments and interventions, ordering and review of laboratory studies, ordering and review of radiographic studies, pulse oximetry and re-evaluation of patient's condition.         Final Clinical Impression(s) / ED Diagnoses Final diagnoses:  Fever, unspecified fever cause    Rx / DC Orders ED Discharge Orders     None         Jacalyn Lefevre, MD 11/19/23 [id number]

## 2023-11-19 NOTE — ED Triage Notes (Addendum)
Patient via [facility] from home c/o generalized weakness and frequent urination. Denies dysuria; reports foul smelling urine. Aox4, GCS 15. Family reports temp of 104F; last tylenol @100 . Lungs cta.   Bgl 124.  BP 181/102 97% on Room Air  20g L-forearm

## 2023-11-19 NOTE — ED Notes (Signed)
Patient refused covid swab

## 2023-11-19 NOTE — ED Notes (Signed)
Patient refusing 3rd antibiotic ordered in the sepsis protocol. States he is feeling better and wants to go home. Patient also continues to refuse respiratory swab. Patient also refusing CT scan - patient educated these test will help identify the source of infection - pt continues to refuse. Dr. Particia Nearing made aware.

## 2023-11-19 NOTE — Discharge Instructions (Addendum)
We do not know the source of your fever.  Please follow up with your doctor.  You are welcome to return at any time.

## 2023-11-23 ENCOUNTER — Other Ambulatory Visit (HOSPITAL_COMMUNITY): Payer: Self-pay

## 2023-11-23 ENCOUNTER — Other Ambulatory Visit: Payer: Self-pay

## 2023-11-23 DIAGNOSIS — B20 Human immunodeficiency virus [HIV] disease: Secondary | ICD-10-CM

## 2023-11-23 MED ORDER — DOLUTEGRAVIR-LAMIVUDINE 50-300 MG PO TABS: 1.0000 | ORAL_TABLET | Freq: Every day | ORAL | 3 refills | Status: DC

## 2023-11-24 LAB — CULTURE, BLOOD (ROUTINE X 2)
Culture: NO GROWTH
Culture: NO GROWTH

## 2024-03-16 ENCOUNTER — Other Ambulatory Visit: Payer: Self-pay | Admitting: Internal Medicine

## 2024-03-16 DIAGNOSIS — B20 Human immunodeficiency virus [HIV] disease: Secondary | ICD-10-CM

## 2024-04-12 ENCOUNTER — Other Ambulatory Visit: Payer: Self-pay | Admitting: Internal Medicine

## 2024-04-12 ENCOUNTER — Other Ambulatory Visit: Payer: Self-pay

## 2024-04-12 DIAGNOSIS — B20 Human immunodeficiency virus [HIV] disease: Secondary | ICD-10-CM

## 2024-04-12 MED ORDER — DOVATO 50-300 MG PO TABS: 1.0000 | ORAL_TABLET | Freq: Every day | ORAL | 0 refills | Status: DC

## 2024-04-25 ENCOUNTER — Encounter: Payer: Self-pay | Admitting: Internal Medicine

## 2024-04-25 ENCOUNTER — Other Ambulatory Visit: Payer: Self-pay

## 2024-04-25 ENCOUNTER — Ambulatory Visit (INDEPENDENT_AMBULATORY_CARE_PROVIDER_SITE_OTHER): Admitting: Internal Medicine

## 2024-04-25 VITALS — BP 165/90 | HR 78 | Temp 98.5°F | Wt 234.6 lb

## 2024-04-25 DIAGNOSIS — R7989 Other specified abnormal findings of blood chemistry: Secondary | ICD-10-CM | POA: Diagnosis not present

## 2024-04-25 DIAGNOSIS — B20 Human immunodeficiency virus [HIV] disease: Secondary | ICD-10-CM | POA: Diagnosis not present

## 2024-04-25 DIAGNOSIS — I2583 Coronary atherosclerosis due to lipid rich plaque: Secondary | ICD-10-CM | POA: Diagnosis not present

## 2024-04-25 DIAGNOSIS — I2510 Atherosclerotic heart disease of native coronary artery without angina pectoris: Secondary | ICD-10-CM

## 2024-04-25 MED ORDER — DOLUTEGRAVIR-LAMIVUDINE 50-300 MG PO TABS: 1.0000 | ORAL_TABLET | Freq: Every day | ORAL | 11 refills | Status: DC
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
## 2024-04-25 NOTE — Patient Instructions (Addendum)
 You are doing very well on dovato ; renewal placed   Blood tests today   Consider a statin to reduce risk heart attack/stroke   Check your blood pressure once a day at home different time of the days; do this for 7 days and if average > 140s/90s then please let me know     Primary care number (internal med teaching service) **

## 2024-04-26 LAB — T-HELPER CELLS (CD4) COUNT (NOT AT ARMC)
CD4 % Helper T Cell: 30 % — ABNORMAL LOW (ref 33–65)
CD4 T Cell Abs: 638 /uL (ref 400–1790)

## 2024-04-27 LAB — BASIC METABOLIC PANEL WITH GFR
BUN: 7 mg/dL (ref 7–25)
CO2: 26 mmol/L (ref 20–32)
Calcium: 10.1 mg/dL (ref 8.6–10.3)
Chloride: 105 mmol/L (ref 98–110)
Creat: 1 mg/dL (ref 0.70–1.28)
Glucose, Bld: 93 mg/dL (ref 65–99)
Potassium: 4 mmol/L (ref 3.5–5.3)
Sodium: 139 mmol/L (ref 135–146)
eGFR: 80 mL/min/1.73m2 (ref >=60)

## 2024-04-27 LAB — HIV-1 RNA QUANT-NO REFLEX-BLD
HIV 1 RNA Quant: <20 {copies}/mL — AB
HIV-1 RNA Quant, Log: <1.3 {Log_copies}/mL — AB

## 2024-06-20 ENCOUNTER — Emergency Department (HOSPITAL_COMMUNITY)
Admission: EM | Admit: 2024-06-20 | Discharge: 2024-06-21 | Discharge status: Against Medical Advice | Attending: Emergency Medicine | Admitting: Emergency Medicine

## 2024-06-20 ENCOUNTER — Encounter (HOSPITAL_COMMUNITY): Payer: Self-pay | Admitting: Emergency Medicine

## 2024-06-20 ENCOUNTER — Emergency Department (HOSPITAL_COMMUNITY)

## 2024-06-20 ENCOUNTER — Other Ambulatory Visit: Payer: Self-pay

## 2024-06-20 DIAGNOSIS — R509 Fever, unspecified: Secondary | ICD-10-CM | POA: Diagnosis not present

## 2024-06-20 DIAGNOSIS — Z1810 Retained metal fragments, unspecified: Secondary | ICD-10-CM | POA: Diagnosis not present

## 2024-06-20 DIAGNOSIS — R0981 Nasal congestion: Secondary | ICD-10-CM | POA: Diagnosis not present

## 2024-06-20 DIAGNOSIS — M25562 Pain in left knee: Secondary | ICD-10-CM | POA: Insufficient documentation

## 2024-06-20 DIAGNOSIS — R5383 Other fatigue: Secondary | ICD-10-CM | POA: Diagnosis not present

## 2024-06-20 DIAGNOSIS — R059 Cough, unspecified: Secondary | ICD-10-CM | POA: Diagnosis not present

## 2024-06-20 DIAGNOSIS — R531 Weakness: Secondary | ICD-10-CM | POA: Diagnosis not present

## 2024-06-20 DIAGNOSIS — M25561 Pain in right knee: Secondary | ICD-10-CM | POA: Insufficient documentation

## 2024-06-20 DIAGNOSIS — M791 Myalgia, unspecified site: Secondary | ICD-10-CM | POA: Insufficient documentation

## 2024-06-20 DIAGNOSIS — M25461 Effusion, right knee: Secondary | ICD-10-CM | POA: Diagnosis not present

## 2024-06-20 DIAGNOSIS — M25462 Effusion, left knee: Secondary | ICD-10-CM | POA: Diagnosis not present

## 2024-06-20 DIAGNOSIS — I6782 Cerebral ischemia: Secondary | ICD-10-CM | POA: Diagnosis not present

## 2024-06-20 DIAGNOSIS — Z5329 Procedure and treatment not carried out because of patient's decision for other reasons: Secondary | ICD-10-CM | POA: Diagnosis not present

## 2024-06-20 DIAGNOSIS — M1712 Unilateral primary osteoarthritis, left knee: Secondary | ICD-10-CM | POA: Diagnosis not present

## 2024-06-20 DIAGNOSIS — R4182 Altered mental status, unspecified: Secondary | ICD-10-CM | POA: Diagnosis not present

## 2024-06-20 DIAGNOSIS — M1711 Unilateral primary osteoarthritis, right knee: Secondary | ICD-10-CM | POA: Diagnosis not present

## 2024-06-20 DIAGNOSIS — Z21 Asymptomatic human immunodeficiency virus [HIV] infection status: Secondary | ICD-10-CM | POA: Insufficient documentation

## 2024-06-20 DIAGNOSIS — R9431 Abnormal electrocardiogram [ECG] [EKG]: Secondary | ICD-10-CM | POA: Diagnosis not present

## 2024-06-20 DIAGNOSIS — I700 Atherosclerosis of aorta: Secondary | ICD-10-CM | POA: Diagnosis not present

## 2024-06-20 LAB — CBC WITH DIFFERENTIAL/PLATELET
Abs Immature Granulocytes: 0.01 K/uL (ref 0.00–0.07)
Basophils Absolute: 0.1 K/uL (ref 0.0–0.1)
Basophils Relative: 2 %
Eosinophils Absolute: 0 K/uL (ref 0.0–0.5)
Eosinophils Relative: 0 %
HCT: 42.7 % (ref 39.0–52.0)
Hemoglobin: 14.4 g/dL (ref 13.0–17.0)
Immature Granulocytes: 0 %
Lymphocytes Relative: 18 %
Lymphs Abs: 0.9 K/uL (ref 0.7–4.0)
MCH: 33.4 pg (ref 26.0–34.0)
MCHC: 33.7 g/dL (ref 30.0–36.0)
MCV: 99.1 fL (ref 80.0–100.0)
Monocytes Absolute: 0.7 K/uL (ref 0.1–1.0)
Monocytes Relative: 13 %
Neutro Abs: 3.4 K/uL (ref 1.7–7.7)
Neutrophils Relative %: 67 %
Platelets: 261 K/uL (ref 150–400)
RBC: 4.31 MIL/uL (ref 4.22–5.81)
RDW: 13.3 % (ref 11.5–15.5)
WBC: 5.1 K/uL (ref 4.0–10.5)
nRBC: 0 % (ref 0.0–0.2)

## 2024-06-20 LAB — URINALYSIS, W/ REFLEX TO CULTURE (INFECTION SUSPECTED)
Bacteria, UA: NONE SEEN
Bilirubin Urine: NEGATIVE
Glucose, UA: NEGATIVE mg/dL
Hgb urine dipstick: NEGATIVE
Ketones, ur: NEGATIVE mg/dL
Leukocytes,Ua: NEGATIVE
Nitrite: NEGATIVE
Protein, ur: 30 mg/dL — AB
Specific Gravity, Urine: 1.03 (ref 1.005–1.030)
pH: 5 (ref 5.0–8.0)

## 2024-06-20 LAB — COMPREHENSIVE METABOLIC PANEL WITH GFR
ALT: 21 U/L (ref 0–44)
AST: 20 U/L (ref 15–41)
Albumin: 4 g/dL (ref 3.5–5.0)
Alkaline Phosphatase: 69 U/L (ref 38–126)
Anion gap: 12 (ref 5–15)
BUN: 8 mg/dL (ref 8–23)
CO2: 22 mmol/L (ref 22–32)
Calcium: 9.3 mg/dL (ref 8.9–10.3)
Chloride: 103 mmol/L (ref 98–111)
Creatinine, Ser: 1.19 mg/dL (ref 0.61–1.24)
GFR, Estimated: >60 mL/min (ref >60)
Glucose, Bld: 99 mg/dL (ref 70–99)
Potassium: 3.5 mmol/L (ref 3.5–5.1)
Sodium: 137 mmol/L (ref 135–145)
Total Bilirubin: 0.7 mg/dL (ref 0.0–1.2)
Total Protein: 7.5 g/dL (ref 6.5–8.1)

## 2024-06-20 LAB — I-STAT CG4 LACTIC ACID, ED: Lactic Acid, Venous: 0.9 mmol/L (ref 0.5–1.9)

## 2024-06-20 NOTE — ED Notes (Signed)
 Pt declined the respiratory panel

## 2024-06-20 NOTE — ED Triage Notes (Signed)
 Pt c.o fever and generalized malaise that started today. 101.5 temp with ems, given 1G of tylenol . No n/v/d, non productive cough

## 2024-06-20 NOTE — ED Provider Triage Note (Signed)
 Emergency Medicine Provider Triage Evaluation Note  John Rush , a 73 y.o. male  was evaluated in triage.  Pt complains of fatigue.  Review of Systems  Positive: Fatigue, bilateral knee pain, confusion Negative: Chest pain, shortness of breath, abdominal pain  Physical Exam  BP (!) 156/83 (BP Location: Right Arm)   Pulse 93   Temp 100 F (37.8 C) (Oral)   Resp 19   SpO2 98%  Gen:   Awake, no distress   Resp:  Normal effort  MSK:   Moves extremities without difficulty   Medical Decision Making  Medically screening exam initiated at 6:48 PM.  Appropriate orders placed.  Ryoma Nofziger was informed that the remainder of the evaluation will be completed by another provider, this initial triage assessment does not replace that evaluation, and the importance of remaining in the ED until their evaluation is complete.  Patient presents for fatigue.  Onset was today.  He stayed in the bed for most of the day.  His wife felt like he seemed confused.  EMS was called to his home.  He endorsed bilateral knee pain.  He had a documented fever with EMS.  He was given Tylenol  prior to arrival.  After Tylenol , the pain resolved.  He denies any current complaints.  Per chart review, he does have a history of HIV which is currently well-controlled.   Melvenia Motto, MD 06/20/24 [phone]

## 2024-06-21 ENCOUNTER — Emergency Department (HOSPITAL_COMMUNITY)

## 2024-06-21 DIAGNOSIS — M25561 Pain in right knee: Secondary | ICD-10-CM | POA: Diagnosis not present

## 2024-06-21 DIAGNOSIS — M25461 Effusion, right knee: Secondary | ICD-10-CM | POA: Diagnosis not present

## 2024-06-21 DIAGNOSIS — M1711 Unilateral primary osteoarthritis, right knee: Secondary | ICD-10-CM | POA: Diagnosis not present

## 2024-06-21 DIAGNOSIS — M25462 Effusion, left knee: Secondary | ICD-10-CM | POA: Diagnosis not present

## 2024-06-21 DIAGNOSIS — M1712 Unilateral primary osteoarthritis, left knee: Secondary | ICD-10-CM | POA: Diagnosis not present

## 2024-06-21 DIAGNOSIS — I6782 Cerebral ischemia: Secondary | ICD-10-CM | POA: Diagnosis not present

## 2024-06-21 DIAGNOSIS — M25562 Pain in left knee: Secondary | ICD-10-CM | POA: Diagnosis not present

## 2024-06-21 DIAGNOSIS — R4182 Altered mental status, unspecified: Secondary | ICD-10-CM | POA: Diagnosis not present

## 2024-06-21 LAB — TROPONIN I (HIGH SENSITIVITY)
Troponin I (High Sensitivity): 14 ng/L (ref <18)
Troponin I (High Sensitivity): 15 ng/L (ref <18)

## 2024-06-21 MED ORDER — LACTATED RINGERS IV BOLUS
1000.0000 mL | Freq: Once | INTRAVENOUS | Status: AC
  Administered 2024-06-21: 1000 mL via INTRAVENOUS

## 2024-06-21 MED ORDER — SODIUM CHLORIDE 0.9 % IV SOLN
1.0000 g | Freq: Once | INTRAVENOUS | Status: AC
  Administered 2024-06-21: 1 g via INTRAVENOUS
  Filled 2024-06-21: qty 10

## 2024-06-21 NOTE — Discharge Instructions (Signed)
 You are leaving AGAINST MEDICAL ADVICE.  Admission to the hospital recommended given uncertain etiology of your fever and immunocompromise state.  You will be called if your blood cultures are positive.  Follow-up with your doctor.  Return to the ED with new or worsening symptoms.
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
## 2024-06-25 LAB — CULTURE, BLOOD (ROUTINE X 2)
Culture: NO GROWTH
Special Requests: ADEQUATE

## 2024-06-26 LAB — CULTURE, BLOOD (ROUTINE X 2): Culture: NO GROWTH

## 2024-09-19 NOTE — Progress Notes (Signed)
 The 10-year ASCVD risk score (Arnett DK, et al., 2019) is: 29.7%   Values used to calculate the score:     Age: 73 years     Clincally relevant sex: Male     Is Non-Hispanic African American: Yes     Diabetic: No     Tobacco smoker: No     Systolic Blood Pressure: 158 mmHg     Is BP treated: Yes     HDL Cholesterol: 39 mg/dL     Total Cholesterol: 200 mg/dL  NO CURRENT STATIN THERAPY.   Kevis Qu, BSN, RN

## 2024-10-31 ENCOUNTER — Ambulatory Visit: Payer: Self-pay | Admitting: Internal Medicine

## 2024-11-22 ENCOUNTER — Ambulatory Visit: Admitting: Internal Medicine

## 2024-11-22 ENCOUNTER — Other Ambulatory Visit: Payer: Self-pay

## 2024-11-22 ENCOUNTER — Encounter: Payer: Self-pay | Admitting: Internal Medicine

## 2024-11-22 VITALS — BP 134/77 | HR 99 | Temp 98.6°F | Ht 73.0 in | Wt 230.0 lb

## 2024-11-22 DIAGNOSIS — B20 Human immunodeficiency virus [HIV] disease: Secondary | ICD-10-CM | POA: Diagnosis not present

## 2024-11-22 MED ORDER — DOLUTEGRAVIR-LAMIVUDINE 50-300 MG PO TABS: 1.0000 | ORAL_TABLET | Freq: Every day | ORAL | 11 refills | Status: AC

## 2024-11-22 NOTE — Patient Instructions (Signed)
 You are doing great No labs today  See me in 09/2025 Dovato  renewed for the whole year

## 2024-11-22 NOTE — Progress Notes (Signed)
 "       [facility]  Cc - hiv care f/u    Patient Active Problem List   Diagnosis Date Noted   Backache 05/01/2009   OTHER PREMATURE BEATS 12/19/2008   FRONTAL SINUSITIS 12/18/2008   Genital herpes 11/10/2006   TOBACCO USER 11/10/2006   Essential hypertension 11/10/2006   HIV DISEASE 05/24/2003      HPI: John Rush is a 74 y.o. male well controlled hiv here for ongoing care    11/22/24 id clinic f/u Here with wife No health concern Compliant with dovato ; wants to continue Doensn't want any vaccine Discussed reprieve trial he doesn't want to be on statin   04/25/24 id clinic f/u Doing well on dovato  no missed dose last 4 weeks No complaint today His blood pressure is a little high here 160s/90s; never dx htn Here with wife today    12/10/22 id clinic f/u Due for labs today We switched him from genvoya to dovato  09/11/22 No missed dose last 4 weeks. No side effect from dovato  No new medication since last visit No concern in his health today He asked about his blood pressure today which was a little high and he thought the cuff was small and caused his bp to go high. He has no hypertension problem  No fever, nightsweat, weight loss, decreased appetite No rash, cough, diarrhea/bloody-black stool Chronic intermittent bilateral knee pain    ------------ I first see him 09/11/22; reviewed chart from [facility] and our [facility] clinic chart on epic #hiv Dx'ed 1990s; heterosexual; got it from wife OI December 23, 2016 pjp; cd4 70s nadir Therapy;  -started on atripla in 2006/12/23 -went off ART in 12-23-10 as he thought he was cured -restarted on genvoya in 12-23-2016 after admissions for PJP pneumonia  Patient has been compliant with genvoya since starting in December 23, 2016. He is immunologically and virologically controlled. Compliant -- no missed dose last 4 weeks. He doesn't want to change meds ART right now what ain't broke doesn't need fixing.  No f/c No  n/v/diarrhea No rash No headache No chest pain/dyspnea/cough   He has a pcp at [facility] (nurse practitioner).     #no hx syphilis  #social -born/raised in [place], [place]. He stayed in [place]  14 years where he met his wife (hiv/aids who had passed away Dec 23, 2000). They have a biological son who is not infected (wife on art during pregnancy) -current wife married 5 years ago (she has 2 sons/daughter of her own) -travel prior: [place] , [place] . Out of the country to [place] for half a day -no smoking, etoh, substance use. Dec 23, 2016 was his last cigarette -retired from [employer]. Was a education officer, environmental since 30. 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 His last psa 06/2022 was 12. He is not currently seeing urology    Review of Systems: ROS  All other ros negative     No past medical history on file.  Social History   Tobacco Use   Smoking status: Former    Types: Cigarettes   Smokeless tobacco: Never  Substance Use Topics   Alcohol use: Not Currently   Drug use: Not Currently    Types: Marijuana, Crack cocaine    No family history on file.  No Known Allergies  OBJECTIVE: Vitals:   11/22/24 1108 11/22/24 1132  BP: 139/88 134/77  Pulse: 99   Temp: 98.6  F (37 C)   TempSrc: Oral   SpO2: 97%   Weight: 230 lb (104.3 kg)   Height: 6' 1 (1.854 m)     Body mass index is 30.34 kg/m.   Physical Exam General/constitutional: no distress, pleasant HEENT: Normocephalic, PER, Conj Clear, EOMI, Oropharynx clear Neck supple CV: rrr no mrg Lungs: clear to auscultation, normal respiratory effort Abd: Soft, Nontender Ext: no edema Skin: No Rash Neuro: nonfocal MSK: no peripheral joint swelling/tenderness/warmth; back spines nontender     Lab: Lab Results  Component Value Date   WBC 5.1 06/20/2024   HGB 14.4  06/20/2024   HCT 42.7 06/20/2024   MCV 99.1 06/20/2024   PLT 261 06/20/2024   Last metabolic panel Lab Results  Component Value Date   GLUCOSE 99 06/20/2024   NA 137 06/20/2024   K 3.5 06/20/2024   CL 103 06/20/2024   CO2 22 06/20/2024   BUN 8 06/20/2024   CREATININE 1.19 06/20/2024   EGFR 80 04/25/2024   CALCIUM 9.3 06/20/2024   PROT 7.5 06/20/2024   ALBUMIN 4.0 06/20/2024   BILITOT 0.7 06/20/2024   ALKPHOS 69 06/20/2024   AST 20 06/20/2024   ALT 21 06/20/2024   ANIONGAP 12 06/20/2024    HIV: 08/2023                    ud 11/2022                   <20   /     561  (21%) 06/2022 ([facility])         ud     /     527 (28%) 02/2022 ([facility])    ud     /     420s (25%)  Microbiology:  Serology: [facility] record 10/2021  hep a ab positive; hep b sAb nonreactive; hep c ab nonreactive  Imaging:   Assessment/plan: Problem List Items Addressed This Visit   None Visit Diagnoses       HIV disease (HCC)    -  Primary   Relevant Medications   dolutegravir -lamiVUDine  (DOVATO ) 50-300 MG tablet         #hiv Will switch him from genvoya to dovato  He has SPAP (supplemental to [facility]) He desires dovato  to be mailed to him  Started dovato  08/2022. Likes it. compliant  11/22/24 tolerating dovato ; well controlled   -discussed u=u -encourage compliance -continue current HIV medication dovato  -labs today -f/u in 11 months  -we discussed reprieve trial again; I encourage patient to think about starting statin. Rediscuss again today 11/22/24   #elevated blood pressure Patient doesn't have htn; just high today  -advise daily check at home and let me know if >140s/90s    #hcm -vaccination Doesn't want any vaccine Discussed pneumococcal vaccine -- patient doesn't want to do -hepatitis 2023 hep b sab, sab, cab negative; No risk deferred hep b repeat testing -std screening No risk patient wants to defer -tb screening quantiferon gold negative 2023  -cancer screening Done  by pcp -pcp [facility]; asking to see if can get primary care clinic f/u given new insurance as of 04/25/24  FIT test 2022 normal Psa again was a little high 06/2022 -- followed by pcp; hx prostate cancer not currently followed by urology       Follow-up: Return in about 11 months (around 10/22/2025).  Constance ONEIDA Passer, MD [facility] 11/22/2024, 11:39  AM  "

## 2025-10-09 ENCOUNTER — Ambulatory Visit: Admitting: Internal Medicine

## 2025-10-11 ENCOUNTER — Ambulatory Visit: Payer: Self-pay | Admitting: Internal Medicine

## 2025-10-16 ENCOUNTER — Ambulatory Visit: Admitting: Internal Medicine
